# Patient Record
Sex: Male | Born: 1960 | Race: White | Hispanic: No | Smoking: Never smoker
Health system: Southern US, Community
[De-identification: ages and names within clinical notes are randomized; demographics above are authoritative.]

## PROBLEM LIST (undated history)

## (undated) DIAGNOSIS — K7460 Unspecified cirrhosis of liver: Secondary | ICD-10-CM

## (undated) DIAGNOSIS — Z87442 Personal history of urinary calculi: Secondary | ICD-10-CM

## (undated) DIAGNOSIS — K760 Fatty (change of) liver, not elsewhere classified: Secondary | ICD-10-CM

## (undated) DIAGNOSIS — Z860100 Personal history of colon polyps, unspecified: Secondary | ICD-10-CM

## (undated) DIAGNOSIS — I10 Essential (primary) hypertension: Secondary | ICD-10-CM

## (undated) DIAGNOSIS — E119 Type 2 diabetes mellitus without complications: Secondary | ICD-10-CM

## (undated) DIAGNOSIS — Z8719 Personal history of other diseases of the digestive system: Secondary | ICD-10-CM

## (undated) DIAGNOSIS — E6609 Other obesity due to excess calories: Secondary | ICD-10-CM

## (undated) HISTORY — DX: Personal history of other diseases of the digestive system: Z87.19

## (undated) HISTORY — DX: Unspecified cirrhosis of liver: K74.60

## (undated) HISTORY — PX: TONSILLECTOMY: SUR1361

## (undated) HISTORY — DX: Type 2 diabetes mellitus without complications: E11.9

## (undated) HISTORY — DX: Personal history of urinary calculi: Z87.442

## (undated) HISTORY — DX: Other obesity due to excess calories: E66.09

## (undated) HISTORY — DX: Essential (primary) hypertension: I10

## (undated) HISTORY — DX: Fatty (change of) liver, not elsewhere classified: K76.0

## (undated) HISTORY — PX: KNEE SURGERY: SHX244

## (undated) HISTORY — PX: SHOULDER OPEN ROTATOR CUFF REPAIR: SHX2407

## (undated) HISTORY — DX: Personal history of colonic polyps: Z86.010

## (undated) HISTORY — DX: Personal history of colon polyps, unspecified: Z86.0100

---

## 2000-07-14 ENCOUNTER — Encounter: Payer: Self-pay | Admitting: Specialist

## 2000-07-14 ENCOUNTER — Inpatient Hospital Stay (HOSPITAL_COMMUNITY): Admission: RE | Admit: 2000-07-14 | Discharge: 2000-07-15 | Payer: Self-pay | Admitting: Specialist

## 2001-10-15 ENCOUNTER — Observation Stay (HOSPITAL_COMMUNITY): Admission: RE | Admit: 2001-10-15 | Discharge: 2001-10-16 | Payer: Self-pay | Admitting: Specialist

## 2001-10-15 ENCOUNTER — Encounter: Payer: Self-pay | Admitting: Specialist

## 2006-10-28 ENCOUNTER — Encounter: Admission: RE | Admit: 2006-10-28 | Discharge: 2006-10-28 | Payer: Self-pay | Admitting: *Deleted

## 2007-01-04 HISTORY — PX: US ECHOCARDIOGRAPHY: HXRAD669

## 2007-07-23 IMAGING — US US ABDOMEN COMPLETE
1 series · 14 of 25 positions shown · non-contrast
Comparison: None.

CLINICAL DATA: Elevated liver enzymes.
ABDOMEN ULTRASOUND:
TECHNIQUE: Complete abdominal ultrasound examination was performed including evaluation of the liver, gallbladder, bile ducts, pancreas, kidneys, spleen, IVC, and abdominal aorta.

[Series 1: us abdomen complete · 0.37mm/px · 14 of 73 slices shown]
[im 1/73]
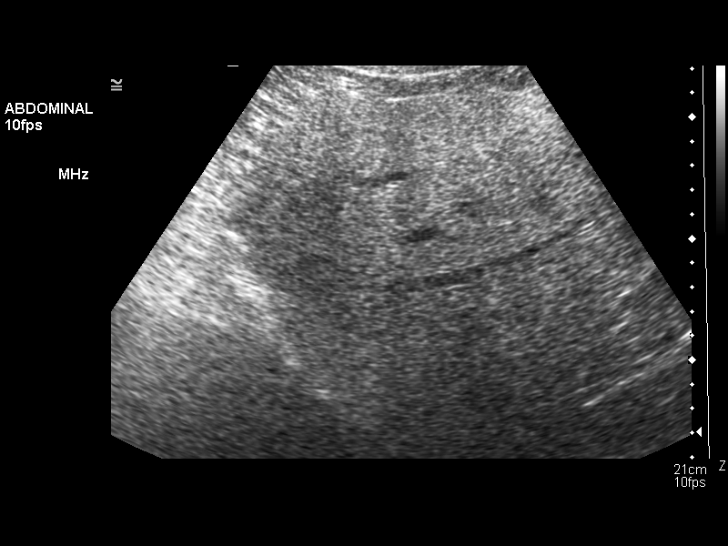
[im 7/73]
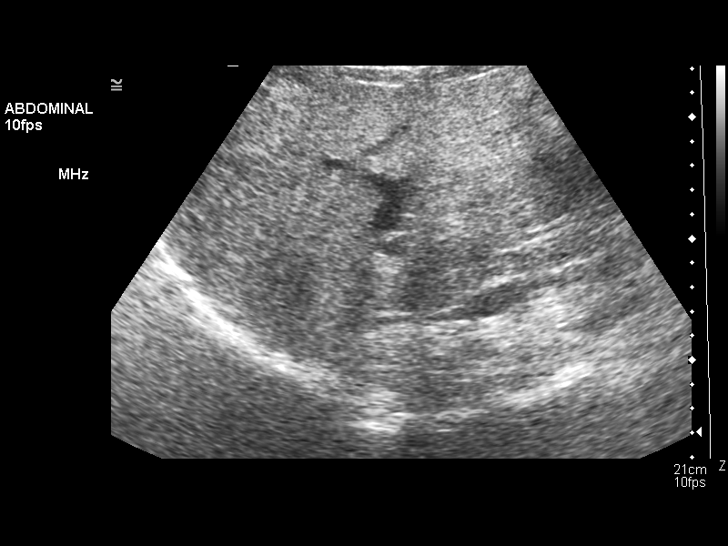
[im 13/73]
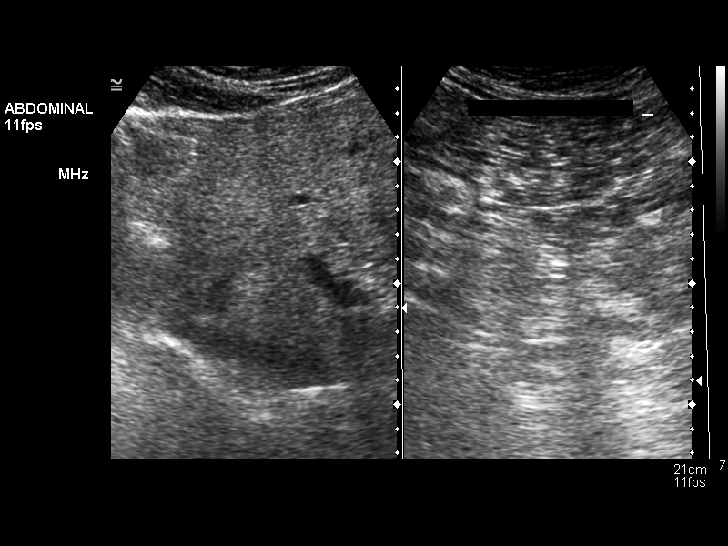
[im 19/73]
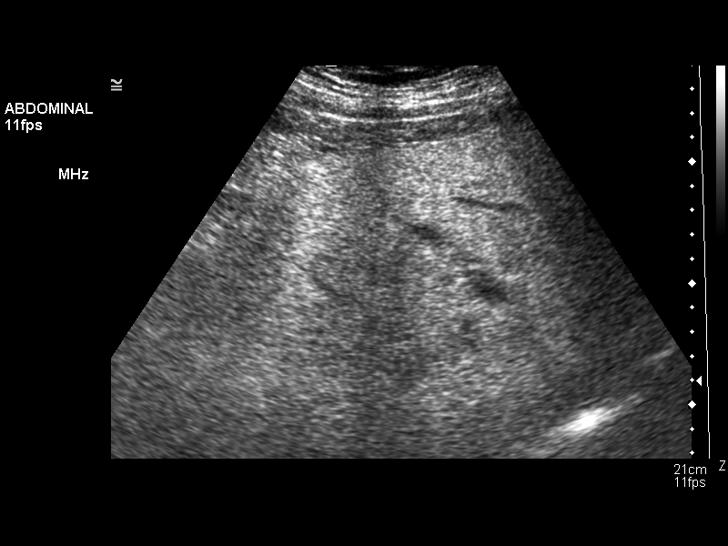
[im 25/73]
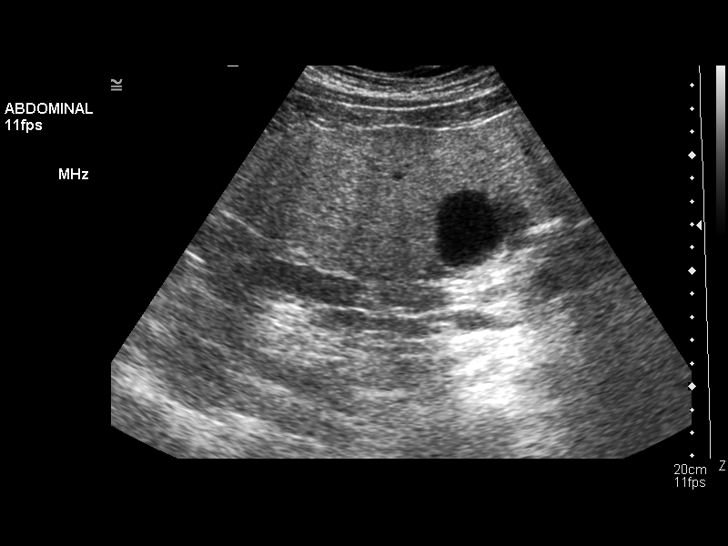
[im 28/73]
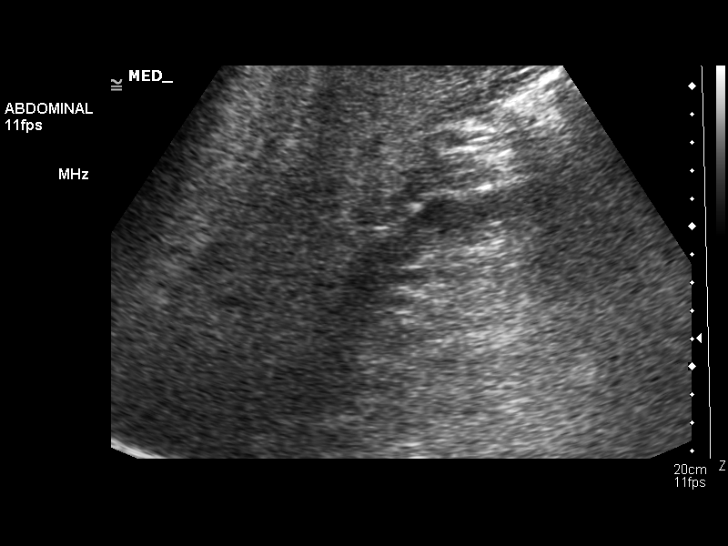
[im 34/73]
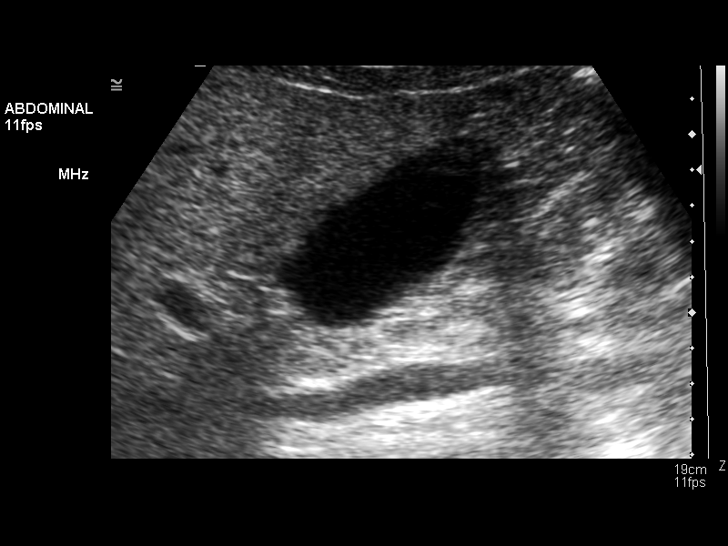
[im 40/73]
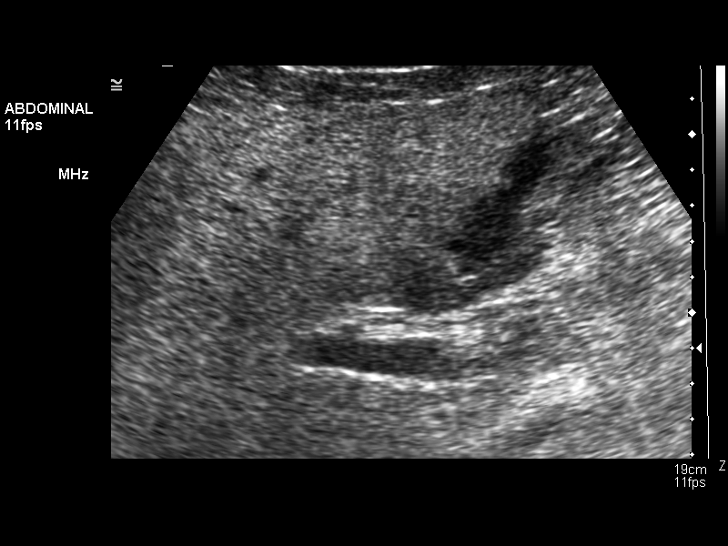
[im 46/73]
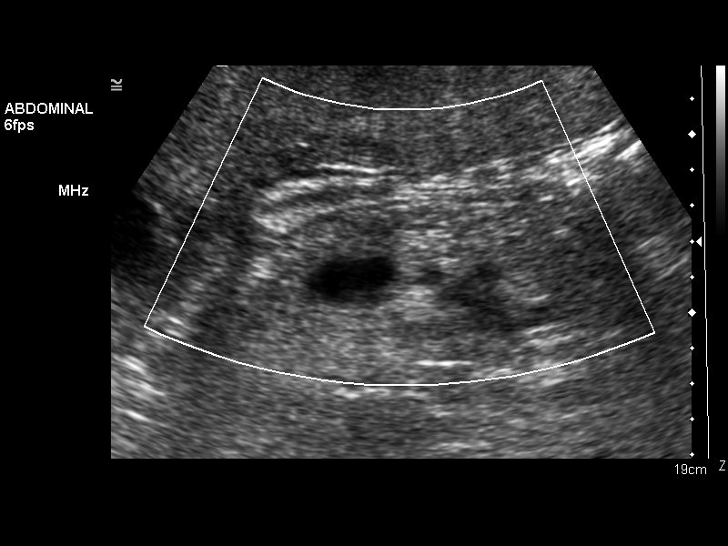
[im 49/73]
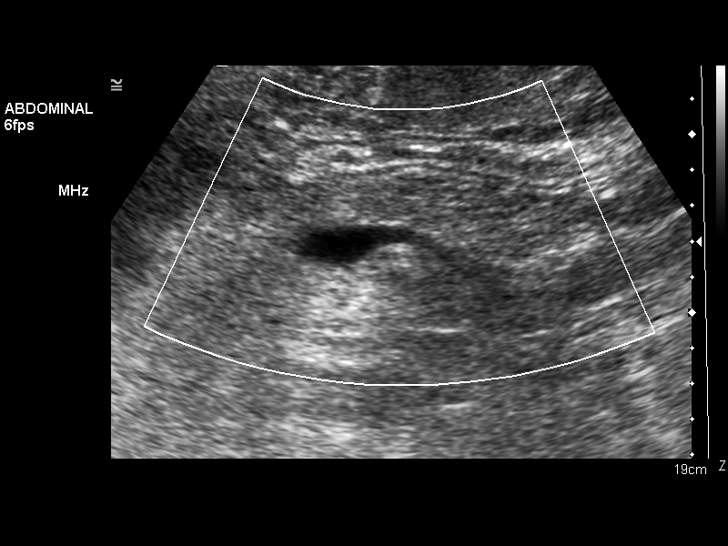
[im 55/73]
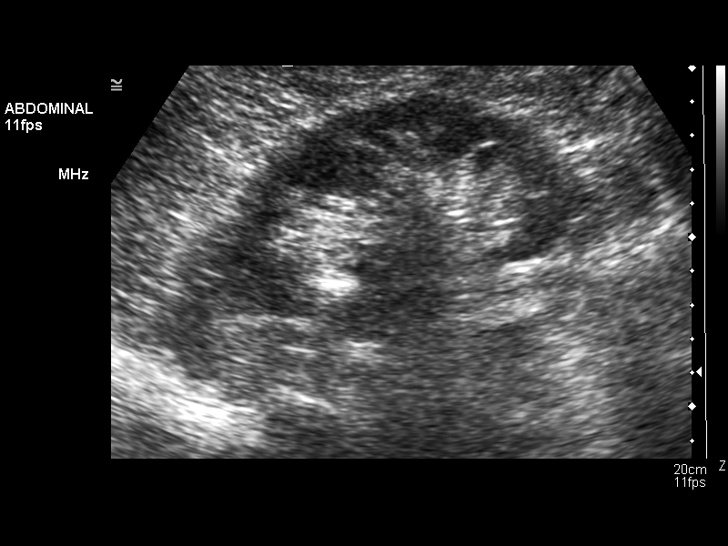
[im 61/73]
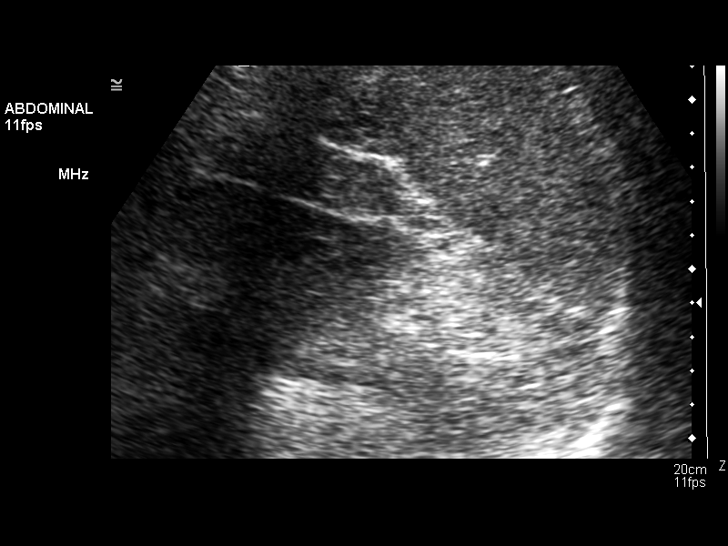
[im 67/73]
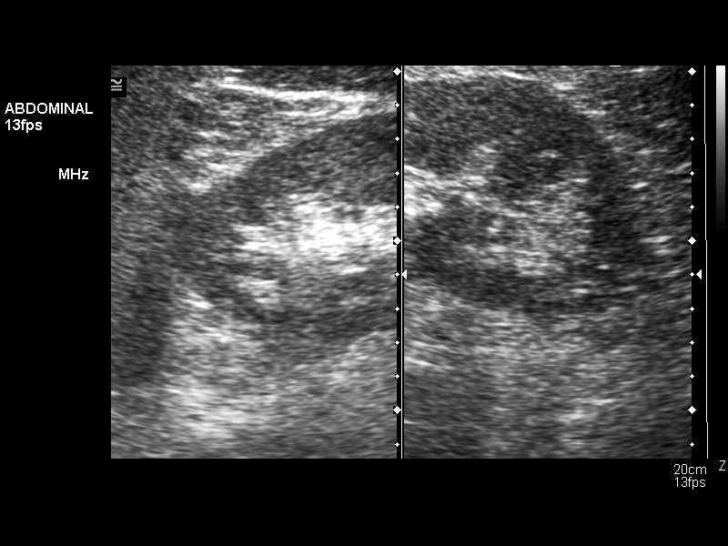
[im 73/73]
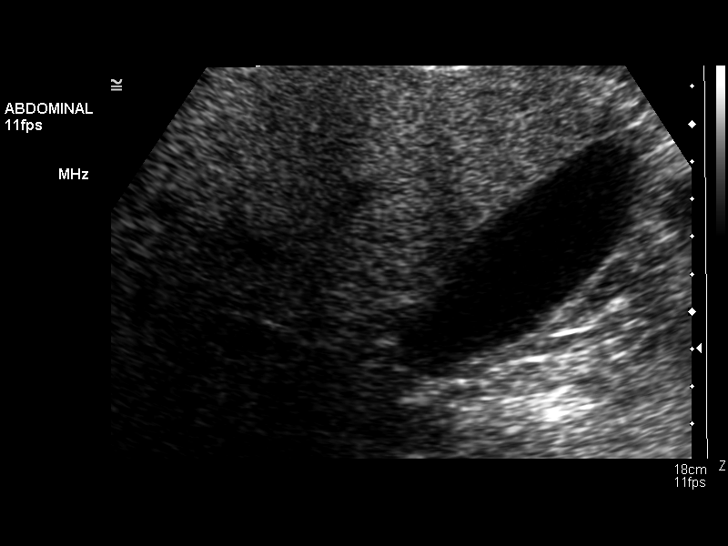

[14 of 25 positions shown; findings below may reference images not displayed]

FINDINGS: The gallbladder is normal without wall thickening, cholelithiasis, or pericholecystic fluid.  Gallbladder wall thickness is 2 mm.  Common bile duct diameter is 2.6 mm.  No dilatation or obstruction.  Liver demonstrates a heterogeneous increased echotexture suspicious for fatty infiltration.  Imaging of the IVC and pancreas is limited because of bowel gas.   The spleen has a length of 8.4 cm.  Right kidney measures 12.4 cm.  Left kidney measures 12.6 cm. No renal obstruction or hydronephrosis.  Imaging of the aorta is also limited due to bowel gas.  Maximal aortic diameter is 2.3 cm.  No ascites.
IMPRESSION: 1.  Heterogenous echogenic liver suggests fatty infiltration. 
2.  No biliary dilatation or acute finding.
3.  Limited imaging of the IVC, pancreas, and aorta.

## 2009-01-02 ENCOUNTER — Ambulatory Visit: Payer: Self-pay | Admitting: Internal Medicine

## 2009-01-02 DIAGNOSIS — K5730 Diverticulosis of large intestine without perforation or abscess without bleeding: Secondary | ICD-10-CM | POA: Insufficient documentation

## 2009-01-02 DIAGNOSIS — R933 Abnormal findings on diagnostic imaging of other parts of digestive tract: Secondary | ICD-10-CM

## 2009-01-04 ENCOUNTER — Encounter: Payer: Self-pay | Admitting: Internal Medicine

## 2009-01-04 ENCOUNTER — Ambulatory Visit: Payer: Self-pay | Admitting: Internal Medicine

## 2009-01-08 ENCOUNTER — Encounter: Payer: Self-pay | Admitting: Internal Medicine

## 2009-02-12 ENCOUNTER — Telehealth: Payer: Self-pay | Admitting: Internal Medicine

## 2009-02-13 ENCOUNTER — Encounter: Payer: Self-pay | Admitting: Internal Medicine

## 2010-08-20 ENCOUNTER — Ambulatory Visit: Payer: Self-pay | Admitting: Cardiology

## 2011-01-08 ENCOUNTER — Ambulatory Visit: Payer: Self-pay | Admitting: Cardiology

## 2011-01-14 NOTE — Procedures (Signed)
Summary: Colonoscopy   Colonoscopy  Procedure date:  01/04/2009  Findings:      Location:  [facility].    Procedures Next Due Date:    Colonoscopy: 01/2012  COLONOSCOPY PROCEDURE REPORT  PATIENT:  Cory Colon, Cory Colon  MR#:  [id number] BIRTHDATE:   11-07-1961   GENDER:   male  ENDOSCOPIST:   Wilhemina Bonito. Eda Keys, MD Referred by: SELF  PROCEDURE DATE:  01/04/2009 PROCEDURE:  Colonoscopy with snare polypectomy ASA CLASS:   Class II INDICATIONS: family Hx of polyps,  questionable abnormal CT of abdomen   MEDICATIONS:    Versed 10 mg IV, Fentanyl 100 mcg IV  DESCRIPTION OF PROCEDURE:   After the risks benefits and alternatives of the procedure were thoroughly explained, informed consent was obtained.  Digital rectal exam was performed and revealed no abnormalities.   The LB CF-[id number] endoscope was introduced through the anus and advanced to the cecum, which was identified by both the appendix and ileocecal valve, without limitations. TIME TO CECUM = 1:46 MIN The quality of the prep was excellent, using MoviPrep.  The instrument was then  withdrawn (TIME = 15:49 MIN) as the colon was fully examined. <<PROCEDUREIMAGES>>            <<OLD IMAGES>>  FINDINGS:  Three polyps were found (cecal 5mm; descending 23mm,4mm). Polyps were snared without cautery. Retrieval was successful.  Mild diverticulosis was found in the sigmoid colon.   Retroflexed views in the rectum revealed no abnormalities.    The scope was then withdrawn from the patient and the procedure completed.  COMPLICATIONS:   None  ENDOSCOPIC IMPRESSION:  1) Three polyps (benign appearring) - removed  2) Mild diverticulosis in the sigmoid colon  3) Normal colonoscopy otherwise  RECOMMENDATIONS:  1) If all of the polyps removed today are proven to be adenomatous (pre-cancerous) polyps, you will need a colonoscopy in 3 years. Otherwise 5 years.     _______________________________ Wilhemina Bonito. Eda Keys,  MD  CC: The Patient;  DR Desmond Dike      REPORT OF SURGICAL PATHOLOGY   Case #: [id number] Patient Name: Cory Colon, Cory Colon. Office Chart Number:  [id number]   MRN: [id number] Pathologist: Alden Server A. Delila Spence, MD DOB/Age  [birth year] (Age: 50)    Gender: M Date Taken:  01/04/2009 Date Received: 01/05/2009   FINAL DIAGNOSIS   ***MICROSCOPIC EXAMINATION AND DIAGNOSIS***   1.  COLON, CECUM POLYP:  TUBULAR ADENOMA.  NO HIGH GRADE DYSPLASIA OR MALIGNANCY IDENTIFIED.   2.  DESCENDING COLON, POLYP:  TUBULAR ADENOMAS.  NO HIGH GRADE DYSPLASIA OR MALIGNANCY IDENTIFIED. (TWO)   gdt Date Reported:  01/08/2009     Alden Server A. Delila Spence, MD  January 08, 2009 MRN: [id number]    Cory Colon [address] [place], [place]  [address]    Dear Cory Colon,  I am pleased to inform you that the colon polyp(s) removed during your recent colonoscopy was (were) found to be benign (no cancer detected) upon pathologic examination.  I recommend you have a repeat colonoscopy examination in 3 years to look for recurrent polyps, as having colon polyps increases your risk for having recurrent polyps or even colon cancer in the future.  Should you develop new or worsening symptoms of abdominal pain, bowel habit changes or bleeding from the rectum or bowels, please schedule an evaluation with either your primary care physician or with me.  Additional information/recommendations:  _X_ No further action with gastroenterology is needed at this time. Please  follow-up with your primary care physician for your other healthcare      needs.  Please call us if you are having persistent problems or have questions about your condition that have not been fully answered at this time.  Sincerely,  Hilarie Fredrickson MD   This report was created from the original endoscopy report, which was reviewed and signed by the above listed endoscopist.

## 2011-05-02 NOTE — Op Note (Signed)
[facility]  Patient:    Cory Colon                          MRN: [id number] Proc. Date: 07/14/00 Adm. 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  POSTOPERATIVE DIAGNOSIS:  The patient was found to have spondylosis changes involving the uncovertebral joints bilaterally at the C6-7 level with acute disk protrusion overlying the right uncovertebral joint causing right C7 nerve root compression.  PROCEDURE:  Anterior cervical diskectomy at the C6-7 level, anterior cervical fusion C6-7 with allograft freeze-dried bone graft and a 24 mm small stature plate and locking screws.  SURGEON:  Kerrin Champagne, M.D.  ASSISTANT:  Georges Lynch. Darrelyn Hillock, M.D.  ANESTHESIA:  GOT.  Dr. Gypsy Balsam.  ESTIMATED BLOOD LOSS:  75 cc.  DRAINS:  Jackson-Pratt drain x 1, left neck, sewn in place.  BRIEF CLINICAL HISTORY:  This patient is a 50 year old male, who has a 59-month history of increasing neck pain radiation to the left shoulder and left arm. He has undergone attempts at conservative management, including use of cervical traction, McKenzie exercise program.  He has undergone use of a Medrol dose pack, all without relief of pain.  He works as an Hotel manager and because of persistent numbness and pain, patient is brought to the operating room to undergo excision of HNP at the C6-7 level.  This clinical examination demonstrates that he has some weakness of right elbow extension, finger extension.  Slight right wrist flexion weakness as well. MRI study demonstrated a large broad-based disk protrusion at the C6-7 level. Patient is also found to have spondylosis changes at C5-6 and C6-7 with primarily disk material to the left side and spurs to the left side, most all of his pain is right-sided.  INTRAOPERATIVE  FINDINGS:  As discussed under postoperative diagnosis.  DESCRIPTION OF PROCEDURE:  After adequate general anesthesia, the patient in a beach chair position.  The head on the [place] horseshoe with the transverse roll of the upper shoulder.  Head in 5 pounds of cervical halter traction.  Standard prep of the anterior aspect of the neck and left neck using Duraprep solution, draped in the usual manner, iodine-impregnated Vi-drape.  Standard preoperative antibiotics.  Incision in line with the skin crease at the expected C6 level approximately 3.5 cm in length through skin and subcutaneous layers using a 10-blade scalpel after infiltration with Marcaine 0.5% with 1:200,000 epinephrine.  The incision carried to the level of the superficial platysma fascia.  This was then spread in the midline and interval developed between the trachea, esophagus and the carotid sheath using blunt dissection and finger dissection to the anterior aspect of the cervical spine and prevertebral fascia. Hand-held Clowards and [employer] then used for retraction and exposure here. The prevertebral fascia was then cauterized along the medial border of the left longus colli muscle and then teased across the midline using a Kitner dissectors.  Spinal needles were then placed at the C5-6 and C6-7 levels. This, with the sheath intact only allowing about a cm of the needle protrude. Needles were then inserted.  Intraoperative radiograph demonstrated the needle at the C5-6 level and the lower needle at C6-7 identifying level.  Under direct visualization, the upper needle was then removed and under direct visualization, the lower needle was then removed and a 15 blade scalpel then used to incise the anterior aspect of the cervical disk at the C6-7 level excising it using pituitary rongeurs and continuing the identification of this level for the remainder of the procedure. Medial border of longus colli muscle was then  developed bilaterally and freed up using first cautery to cauterize the small vessels medially and then Key elevator to elevate the longus colli muscle bilaterally exposing the anterior aspect of the cervical spine. McCullough retractors were then inserted with the foot of the McCullough retractor beneath the medial border of longus coli muscle and retraction obtained across the anterior aspect of the cervical spine providing excellent exposure.  Further incision was then made at the anterior annular material with a 15-blade scalpel, 3 mm Kerrisons then used to debride anterior osteophytes and the anterior-inferior lip of C6 and anterior-superior lip of C7 further exposing the disk space.  Pituitary rongeurs then used to excise disk material intervertebrally.  Small area over the anterior aspect of the vertebral body C6 and C7 were cauterized and then the distraction system inserted using 16 mm screw posts at both C6 and C7.  Excellent distraction was obtained. The endplates that were cartilaginous were then excised using microcurets both over the inferior aspect of C6, superior aspect of C7.  This was taken back to the posterior annular fibers.  This was completed, then the operating room microscope was draped, brought into the field sterilely.  Under the operating room microscope then high-speed bur was then used to carefully bur the endplates and to widen the intervertebral disk space further.  Curets were then used to carefully curet the posterior aspect of the annulus, as well as, the cartilaginous endplates superiorly, inferiorly within the disk space, exposing the posterior aspect of the disk space and the posterior longitudinal ligament.  One mm and 2 mm Kerrisons were then used to excise uncovertebral joint bilaterally.  On the right side, immediately disk material was noted to be posterior to the uncovertebral joint on the right side. Herniated disk material was excised using  further 2 mm Kerrison, as well as, titanium nerve hook.  This was excised out into the neural foramen on the right side until the C7 nerve root was felt to be completely free.  Osteophyte  over the posterior aspect of the intervertebral disk space at C6-7 was excised on the right side and hypertrophic changes of the uncovertebral joint were also excised using 1 and 2 mm Kerrisons.  Left uncovertebral joint was also further excised.  The midline posterior longitudinal ligament was kept in place as this did not appear to be causing any significant nerve root compression or cord compression.  When disk space had been debrided of intervertebral disk material, both neural foramen had been decompressed.  The depth of the intervertebral disk space was measured at 20 mm, the height measured 8 mm. The graft, 8 mm in height and measuring a depth of 17 mm was chosen.  The tricortical iliac crest bone graft, which was freeze-dried had been chosen earlier in the case and had been placed into normal saline solution for well over 30 minutes.  This graft was then cut to the dimensions of 8 mm height using dual blade oscillating saw to a depth of 17 mm.  Graft unfortunately was bevelled posteriorly and this required some shortening of the graft to about 16 mm.  The graft was then carefully tapered to the dimensions of the intervertebral disk space and keyed anteriorly to allow for countersink of the graft to provide better purchase here.  Bleeding was controlled within the intervertebral disk space using thrombin-soaked Gelfoam and releasing traction on the disk space.  When the bleeding was controlled, then the disk space was then redistracted.  Graft was then placed into the intervertebral disk space, releasing traction, the graft nicely fitted and was captured within the intervertebral disk space preserving the height of the intervertebral disk space.  Screw post were then removed above C6 and C7 and  bone wax applied to the bleeding screw post holes to obtain hemostasis.  Excess bone wax was removed.  A high-speed bur was then used to carefully bur the anterior spurs off of the anterior aspect of the disk space at C6-7 carefully smoothing the anterior aspect of this intervertebral disk space for preparation for cervical plate.  The length of the plate was chosen using bone wax applied to a cottonoid string and measured about 25 mm.  A 24 mm plate was chosen.  Plate was carefully bent using the bending arch to the normal lordotic curve of the cervical spine.  Plate was then placed across the disk space on the left side kneeling closely to the anterior aspect of the vertebral bodies of both C6 and C7, held in place in sagittal alignment.  Two fixation pins were placed on the left side at C7 and at C6 holding the plate in place in the sagittal orientation.  Then using the plate gripping drill guide, a 14 mm drill was used to drill a hole in the right side at C6 to a 14 mm depth, tapping was then performed and the 14 mm depth screw x 4.0 was then placed locking and fixing into the locking plate.  Similarly, the right C7 screw was placed. Difficulty was obtained countersinking this screw.  Fixation pins were then removed and then each of the screws placed at C7 on the left side and at C6 on the left side without difficulty drilling, appropriately tapping and placing appropriate 14 x 4.0 screws.  Each of these countersunk nicely with the exception of the right lower screw.  This screw was then removed and redrilling performed using a 14 mm drill bit and then placing a 4.35 bale out screw.  Fixation was obtained at this level on the right side at C7, countersinking the screw nicely.  The locking screws were then each placed at each level affixing each of the screws to the plate.  Irrigation was performed. Intraoperative radiograph obtained, which demonstrated the screws in good position and  alignment without evidence of entry into the spinal canal and with bone graft at the C6-7 level without evidence of retropulsion. Irrigation was performed.  Patient had general ooze from the area of the longus colli muscles, as well as, from the intervertebral disk area, that could not be stopped.  This was a very slow ooze, probably related to Vioxx usage and small areas of debrided bone.  With this then, decision was made to use a 7 mm Jackson-Pratt drain.  Stab incision was made over the anterior aspect of the neck and as expected, there was some bleeding from this site also requiring some electrocautery.  The drain was then inserted, placed along the left side of the neck deeply and sewn in place using a 4-0 nylon suture. Examination of the esophagus demonstrated no abnormalities.  No active  bleeders were felt to be present with examination by scope and direct observation.  Soft tissues were allowed to fall back into place.  The superficial fascia layer of the platysma was then reapproximated with interrupted 3-0 Vicryl sutures, deep subcutaneous layers approximated with interrupted 3-0 Vicryl sutures and the skin closed with interrupted 4-0 Vicryl sutures in an inverted fashion.  Tincture of benzoin and steri-strips applied. Four-by-four affixed to the skin with Hypafix tape.  The drain was connected to Jackson-Pratt bulb and charged.  Patient was then placed in a [place] collar, reactivated and extubated and returned to the recovery room in satisfactory condition. DD:  07/14/00 TD:  07/14/00 Job: [id number]

## 2011-05-02 NOTE — H&P (Signed)
[facility]  Patient:    Cory Colon, Cory Colon Visit Number: [id number] MRN: [id number]          Service Type: Attending:  Kerrin Champagne, M.D. Dictated by:   Dorie Rank, P.A. Adm. Date:  10/15/01                           History and Physical  DATE OF BIRTH:  [birth year]  CHIEF COMPLAINT:  Cervical spine pain with some dysphagia.  HISTORY OF PRESENT ILLNESS:  Cory Colon is a pleasant 50 year old male who in July of 2001 underwent a fusion at C6-7.  He has progressed relatively well; however, on occasion he has had difficulty swallowing and recurrent episodes of regurgitation and dysphagia.  It was felt that with his above stated symptoms, he would benefit from undergoing removal of this plate.  His fusion appeared to be well-healed.  On x-ray in July of 2002, it was also noted that he had a spur at C5-6 and the plate seemed somewhat prominent.  The risks and benefits of the procedure were discussed with the patient in detail and he agreed to proceed.  PAST MEDICAL HISTORY:  He has been in good health.  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  MEDICATIONS: 1. Vioxx 25 mg one p.o. q.d. p.r.n. 2. Allegra D p.r.n.  ALLERGIES:  No known drug allergies.  VICODIN causes nausea and vomiting.  PAST SURGICAL HISTORY:  Tonsillectomy as a child.  Fusion C6-7 vertebrae in July of 2001.  SOCIAL HISTORY:  He has one child.  He lives alone.  He denies alcohol or tobacco use.  He is an air traffic controller.  FAMILY HISTORY:  Mother living at age 53.  She is healthy.  His maternal grandparents had a history of colon cancer, heart disease, and diabetes mellitus.  Father living, age 87.  History of hypertension.  REVIEW OF SYSTEMS:  GENERAL:  No fevers, chills, night sweats or bleeding tendencies.  PULMONARY:  No shortness of breath, productive cough, or hemoptysis.  CARDIOVASCULAR:  No chest pain, angina, or  orthopnea. NEUROLOGIC:  No seizures, headaches, or paralysis.  GASTROINTESTINAL:  No nausea, vomiting, diarrhea, constipation, or melena.  GENITOURINARY:  No dysuria, hematuria, or discharge.  PHYSICAL EXAMINATION:  GENERAL:  Well-developed, well-nourished, pleasant 50 year old white male, alert and oriented x 3.  VITAL SIGNS:  Pulse 64, respirations 12, blood pressure 120/90.  HEENT:  Head is atraumatic, normocephalic.  Oropharynx is clear.  NECK:  Supple.  Negative for carotid bruits bilaterally.  Negative for cervical lymphadenopathy palpated.  CHEST:  Lungs clear to auscultation bilaterally.  No wheezes, rhonchi, or rales.  BREASTS:  Not pertinent to present illness.  HEART:  Distant S1, S2.  Heart is regular rate and rhythm, negative for murmur, rub, or gallop.  ABDOMEN:  Round positive bowel sounds, nontender.  No hepatosplenomegaly.  GENITOURINARY:  No pertinent to present illness.  EXTREMITIES:  Muscle strength to bilateral upper extremities is 5/5 and symmetrical.  Biceps reflexes 1+ and symmetric.  Triceps 1+ and symmetric. Brachial radialis 1+ and symmetric.  Range of motion of the neck shows extension to _________ 20%, lateral bending and rotation by about 15%. Forward bending about a finger breadth and a half from the sternum.  SKIN:  Acyanotic.  No rashes or lesions.  There is a well-healed scar to the anterior neck.  LABORATORY AND X-RAYS:  Preoperative labs and x-rays are pending.  IMPRESSION: 1. Painful hardware from previous fusion at the C6-C7. 2. Osteophyte at C5-6. 3. Seasonal allergies.  PLAN:  The patient is scheduled for excision of plate and anterior osteophyte at the C5-C6 level with Dr. Vira Browns. Dictated by:   Dorie Rank, P.A. Attending:  Kerrin Champagne, M.D. DD:  10/08/01 TD:  10/09/01 Job: [id number]

## 2011-05-02 NOTE — Op Note (Signed)
[facility]  Patient:    Cory Colon, Cory Colon Visit Number: [id number] MRN: [id number]          Service Type: SUR Location: [id number] Attending Physician:  Lubertha South Dictated by:   Kerrin Champagne, M.D. Proc. Date: 10/15/01 Admit Date:  10/15/2001 Discharge Date: 10/16/2001                             Operative Report  PREOPERATIVE DIAGNOSIS:  Retained Synthes plate and screws at the C6-7 level with impingement on the C5-6 anterior disk space and osteophyte formation at this segment, C5-6.  POSTOPERATIVE DIAGNOSIS:  Retained Synthes plate and screws at the C6-7 level with impingement on the C5-6 anterior disk space and osteophyte formation at this segment, C5-6.  PROCEDURE:  Removal of hardware, Synthes locking plate and screws, 28 mm in length plate from the Y7-8 level, and excision of osteophytes over the anterior aspect of the C5-6 level.  SURGEON:  Kerrin Champagne, M.D.  ASSISTANT:  Dorie Rank, P.A.-C.  ANESTHESIA:  GOT - Ninfa Meeker, M.D.  ESTIMATED BLOOD LOSS:  50 cc.  DRAINS:  TLS x 1.  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  Underwent anterior diskectomy and fusion in July of 2001.  Postoperatively, the patient has persisted with complaints of dysphagia and even complaints of occasional regurgitation.  He underwent swallow studies that were unremarkable.  The plate appears to show some impingement on the upper disk space at the C5-6 level, and there does appear to be some anterior osteophyte formation at C5-6 that is quite abundant, probably related to irritation from the adjacent segment.  He is brought to the operating room to undergo excision of the plate and screws and excision of the anterior osteophytes at the C5-6 level.  DESCRIPTION OF PROCEDURE:  After adequate general anesthesia and the patient in the beach chair position, a five pounds of cervical  halter traction with the neck in slight extension, the Mayfield horse shoe used, and well-padded. Arms at the side and well-padded and lower extremities as well.  Standard prep with DuraPrep solution over the anterior aspect of the left side of the cervical spine, draped in the usual manner with an iodine impregnated Vidrape. The incision in line with the old previous incision scar at the expected C5-6 level, approximately 4-5 cm in length through the skin and subcutaneous layers using a 10 blade scalpel after infiltration with Marcaine 0.5% with 1:200,000 epinephrine, 4 cc.  The incision was carried down to the superficial platysmal fascial layer.  This was then incised in line with the skin incision, and through the deeper fascial layer as well using electrocautery. Metzenbaum scissors were then used to spread the deeper fascial planes, exposing the lateral aspect of the trachea and esophagus.  Blunt dissection was then used to carefully probe and develop the interval between the trachea and esophagus to the anterior aspect of the cervical spine where there was abundant scar tissue present over the longus colli muscle and over the anterior aspect of the cervical spine.  Retracting the trachea and esophagus with hand-held Clowards and hand-held retractors, then an incision was made on to the medial border of the longus colli muscle on to the cervical plate using a 15 blade scalpel and then the interval developed between the plate and overlying scar tissue layers using a Barista  as well as Optometrist.  This exposed the plate anteriorly. A McCullough retractor was inserted and careful inspection demonstrated that it was not impinging upon the esophagus significantly.  This was used to help with further exposure here.  The Physicist, medical were then used to further free up the scar tissue over the anterior aspect of the plate, and using sharp dissection with a 15 blade  scalpel, an incision made exposing the inferior aspect of the plate and the two lower screws.  The upper screws were partly covered with osteophyte from the C5-6 level.  The one locking screw was removed on the left side and the locking screw on the right side came out with the screw proper when removed on the right side at the C7 level.  On the left level after removal of the locking screw, the remaining 14 mm screw was then removed.  The superior screw holes were then exposed after carefully performing subperiosteal dissection using Cobb elevator over the superior aspect of the osteophyte on to the anterior body of C5 superiorly.  This was carried both to the right and left.  The high speed bur was then used to remove the osteophyte and expose the two holes present.  End cutting osteophyte resector was used to debride tissue over the anterior aspect of the these holes.  Care was taken to assure no other soft tissue was impinged upon with this device.  After exposure of these areas, then the locking screws were each individually removed, and then the 14 mm screws removed using the larger screwdriver.  The plate was then freed off from the anterior aspect of the cervical spine using a Cobb elevator removed from the ________.  Irrigation was performed. Careful inspection of the esophagus demonstrated no abnormality.  The anterior osteophyte at the C5-6 level was then carefully debrided by first exposing the superior right and left side to the osteophyte, and then retracting the soft tissue carefully, and using the high speed bur to remove this down to the level of the superior aspect of C5 vertebral body.  Following this, irrigation was performed.  There was no significant bleeding from the bone structures over the anterior aspect of C5 present, even with the resection of the osteophyte.  There was however some oozing from the soft tissues and scar tissue over the medial border of the  longus colli muscle in particular, but no bleeder in terms of veins or arteries here.  The esophagus had normal appearance.   Irrigation was performed and a 10 mm TLS drain was placed in the depth of the incision, exiting out inferior to the incision line anteriorly.  The platysmal layer was then approximated with interrupted 3-0 Vicryl sutures, deep subcutaneous layers with interrupted 2-0 Vicryl sutures.  The skin closed with a running subcuticular stitch of 4-0 Vicryl.  A tincture of Benzoin and Steri-Strips applied.  A 4 x 4 affixed to the skin with Hypafix tape.  The patient was then reactivated and returned to the recovery room in satisfactory condition.  All instrument and sponge counts were correct.  The patients plates and screws will be returned to him. Dictated by:   Kerrin Champagne, M.D. Attending Physician:  Lubertha South DD:  10/15/01 TD:  10/18/01 Job: [id number]

## 2011-08-07 ENCOUNTER — Encounter: Payer: Self-pay | Admitting: Nurse Practitioner

## 2011-08-19 ENCOUNTER — Telehealth: Payer: Self-pay | Admitting: Cardiology

## 2011-08-19 ENCOUNTER — Other Ambulatory Visit: Payer: Self-pay | Admitting: *Deleted

## 2011-08-19 DIAGNOSIS — I10 Essential (primary) hypertension: Secondary | ICD-10-CM

## 2011-08-19 NOTE — Telephone Encounter (Signed)
No note required for this note.

## 2011-08-20 ENCOUNTER — Ambulatory Visit: Payer: Self-pay | Admitting: Nurse Practitioner

## 2011-08-27 ENCOUNTER — Other Ambulatory Visit (INDEPENDENT_AMBULATORY_CARE_PROVIDER_SITE_OTHER): Payer: PRIVATE HEALTH INSURANCE | Admitting: *Deleted

## 2011-08-27 ENCOUNTER — Encounter: Payer: Self-pay | Admitting: Nurse Practitioner

## 2011-08-27 ENCOUNTER — Ambulatory Visit (INDEPENDENT_AMBULATORY_CARE_PROVIDER_SITE_OTHER): Payer: PRIVATE HEALTH INSURANCE | Admitting: Nurse Practitioner

## 2011-08-27 VITALS — BP 122/84 | HR 78 | Ht 69.0 in | Wt 275.2 lb

## 2011-08-27 DIAGNOSIS — I10 Essential (primary) hypertension: Secondary | ICD-10-CM

## 2011-08-27 DIAGNOSIS — E669 Obesity, unspecified: Secondary | ICD-10-CM | POA: Insufficient documentation

## 2011-08-27 LAB — HEPATIC FUNCTION PANEL
ALT: 21 U/L (ref 0–53)
Bilirubin, Direct: 0.1 mg/dL (ref 0.0–0.3)
Total Bilirubin: 0.5 mg/dL (ref 0.3–1.2)

## 2011-08-27 LAB — BASIC METABOLIC PANEL
Chloride: 105 mEq/L (ref 96–112)
Creatinine, Ser: 1 mg/dL (ref 0.4–1.5)
GFR: 86.06 mL/min (ref >60.00)
Potassium: 5 mEq/L (ref 3.5–5.1)

## 2011-08-27 LAB — LIPID PANEL
LDL Cholesterol: 109 mg/dL — ABNORMAL HIGH (ref 0–99)
Total CHOL/HDL Ratio: 4
Triglycerides: 139 mg/dL (ref 0.0–149.0)
VLDL: 27.8 mg/dL (ref 0.0–40.0)

## 2011-08-27 NOTE — Patient Instructions (Signed)
Try to work on your diet and weight loss Exercise is encouraged We will see you back in 6 months. Keep a check on your blood pressure at home. Goal is to stay less 135/85 Call for any problems. We will let you know what your labs show in a couple of days.

## 2011-08-27 NOTE — Assessment & Plan Note (Signed)
Blood pressure is ok. No change in his medicines. He is advised to get back on track with diet and weight loss. He will continue to monitor at home. We will see him again in 6 months. Patient is agreeable to this plan and will call if any problems develop in the interim.

## 2011-08-27 NOTE — Progress Notes (Signed)
    Cory Colon Date of Birth: [birth year]   History of Present Illness: Cory Colon is seen today for an 8 month check. He is seen for Dr. Patty Sermons. He is doing well. Unfortunately, his weight has gone back up. Blood pressure has been ok at home. No chest pain or shortness of breath. He is tolerating his medicines. No complaints.   Current Outpatient Prescriptions on File Prior to Visit  Medication Sig Dispense Refill  . amLODipine-olmesartan (AZOR) 5-20 MG per tablet Take 1 tablet by mouth daily.        Marland Kitchen aspirin 81 MG tablet Take 81 mg by mouth daily.       . Nutritional Supplements (JUICE PLUS FIBRE PO) Take by mouth daily.        Marland Kitchen terbinafine (LAMISIL) 250 MG tablet Take 250 mg by mouth daily.          No Known Allergies  Past Medical History  Diagnosis Date  . Hypertension   . Exogenous obesity   . History of liver disease   . History of kidney stones     Past Surgical History  Procedure Date  . Tonsillectomy   . Knee surgery   . Shoulder open rotator cuff repair   . US echocardiography 01/04/2007    EF 55-60%    History  Smoking status  . Former Smoker  . Quit date: 08/07/1991  Smokeless tobacco  . Not on file    History  Alcohol Use No    Family History  Problem Relation Age of Onset  . Hypertension Father     Review of Systems: The review of systems is positive for weight gain.  All other systems were reviewed and are negative.  Physical Exam: BP 122/84  Pulse 78  Ht 5\' 9"  (1.753 m)  Wt 275 lb 3.2 oz (124.83 kg)  BMI 40.64 kg/m2 Patient is very pleasant and in no acute distress. He is obese. Skin is warm and dry. Color is normal.  HEENT is unremarkable. Normocephalic/atraumatic. PERRL. Sclera are nonicteric. Neck is supple. No masses. No JVD. Lungs are clear. Cardiac exam shows a regular rate and rhythm. Abdomen is obese but soft. Extremities are without edema. Gait and ROM are intact. No gross neurologic deficits noted.   LABORATORY DATA:  PENDING   Assessment / Plan:

## 2011-08-27 NOTE — Assessment & Plan Note (Signed)
BMI is 41. We discussed weight loss in depth today.

## 2011-08-29 ENCOUNTER — Telehealth: Payer: Self-pay | Admitting: *Deleted

## 2011-08-29 NOTE — Telephone Encounter (Signed)
Message copied by Eugenia Pancoast on Fri Aug 29, 2011 10:33 AM ------      Message from: Cassell Clement      Created: Thu Aug 28, 2011  8:01 PM       Pl report.  Lipids are Okayexcept  LDL too high.  Must lose weight, watch diet.      BS too high.      Kidney and liver okay. CSD.

## 2011-08-29 NOTE — Telephone Encounter (Signed)
Advised of labs 

## 2011-11-17 ENCOUNTER — Telehealth: Payer: Self-pay | Admitting: Cardiology

## 2011-11-17 NOTE — Telephone Encounter (Signed)
Advised patient

## 2011-11-17 NOTE — Telephone Encounter (Signed)
New Msg: Pt calling wanting to know if pt needs to stop taking azor considering pt BP this morning was 98/62. 98/64 (one week ago)  90/68 (2 wks ago) 100/70 (3 wks ago). Please return pt call to discuss further.

## 2011-11-17 NOTE — Telephone Encounter (Signed)
Left message

## 2011-11-17 NOTE — Telephone Encounter (Signed)
Fu Call: pt returning call to [facility]. Please call back.

## 2011-11-17 NOTE — Telephone Encounter (Signed)
Try leaving off the Azor and continue to monitor the blood pressure closely.  If he goes back up he may need a simpler blood pressure medicine which is less strong.

## 2011-11-17 NOTE — Telephone Encounter (Signed)
Has lost about 25 pounds.  These readings are done after taking medications.  Has been sleeping longer in during the night.  Feels ok otherwise.  Please advise

## 2011-12-29 ENCOUNTER — Encounter: Payer: Self-pay | Admitting: Internal Medicine

## 2012-01-30 ENCOUNTER — Telehealth: Payer: Self-pay | Admitting: Cardiology

## 2012-01-30 DIAGNOSIS — I119 Hypertensive heart disease without heart failure: Secondary | ICD-10-CM

## 2012-01-30 NOTE — Telephone Encounter (Signed)
New Msg: Pt needs refill azor tabs 520 mg. Please call RX in [facility]. Pt needs 90 day supply.    Please return pt call to inform that RX was called in.

## 2012-02-03 ENCOUNTER — Other Ambulatory Visit: Payer: Self-pay | Admitting: Cardiology

## 2012-02-03 MED ORDER — AMLODIPINE-OLMESARTAN 5-20 MG PO TABS: 1.0000 | ORAL_TABLET | Freq: Every day | ORAL | Status: DC

## 2012-02-03 NOTE — Telephone Encounter (Signed)
Okay to refill Azor.

## 2012-02-03 NOTE — Telephone Encounter (Signed)
Per patient he has gained some weight back and blood pressure started going up.  Did start back on Azor and blood pressure doing well.  Today his blood pressure was 120's/80's. Will forward to  Dr. Patty Sermons to make sure ok to fill

## 2012-02-03 NOTE — Telephone Encounter (Signed)
Rx sent to [facility]

## 2012-09-24 ENCOUNTER — Encounter: Payer: Self-pay | Admitting: Internal Medicine

## 2016-02-05 ENCOUNTER — Encounter: Payer: Self-pay | Admitting: Internal Medicine

## 2018-05-11 ENCOUNTER — Encounter: Payer: Self-pay | Admitting: Neurology

## 2018-05-11 ENCOUNTER — Ambulatory Visit (INDEPENDENT_AMBULATORY_CARE_PROVIDER_SITE_OTHER): Payer: POS | Admitting: Neurology

## 2018-05-11 VITALS — BP 116/74 | HR 76 | Ht 68.0 in | Wt 268.0 lb

## 2018-05-11 DIAGNOSIS — R0683 Snoring: Secondary | ICD-10-CM

## 2018-05-11 DIAGNOSIS — R351 Nocturia: Secondary | ICD-10-CM

## 2018-05-11 DIAGNOSIS — G4719 Other hypersomnia: Secondary | ICD-10-CM | POA: Diagnosis not present

## 2018-05-11 DIAGNOSIS — Z6841 Body Mass Index (BMI) 40.0 and over, adult: Secondary | ICD-10-CM

## 2018-05-11 NOTE — Patient Instructions (Signed)

## 2018-05-11 NOTE — Progress Notes (Signed)
Subjective:    Patient ID: Cory Colon is a 57 y.o. male.  HPI     Cory Foley, MD, PhD [facility] [address], [address] [address] [place], [place] [address]  Dear Dr. Buzzy Han,   I saw your patient, Cory Colon, upon your kind request in my neurologic clinic today for initial consultation of his sleep disorder, concern for underlying obstructive sleep apnea. The patient is unaccompanied today. As you know, Cory Colon is a 57 year old right-handed gentleman with an underlying medical history of liver disease, reflux disease, kidney stones, type 2 diabetes, hypertension, and morbid obesity with a BMI of over 40, who reports snoring and excessive daytime somnolence. He is being evaluated for bariatric surgery. I reviewed your office note from 03/15/2018 which you kindly included. He is being considered for laparoscopic sleeve gastrectomy.  His Epworth sleepiness score is 12 out of 24, fatigue score is 25 out of 63. He is married and lives with his wife, he has one son age 45 from his first marriage. He is a nonsmoker and does not utilize alcohol typically, has caffeine in limitation, not every day. 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 His bedtime is around 10:30, rise time around 5. When he was in air traffic control he had swing shift including first, second and third shift. He denies telltale symptoms of restless leg syndrome. He has nocturia about once per average night. Denies morning headaches. His snoring can be loud and disturbing to his wife. He does not have a family history of obstructive sleep apnea. They do have a TV in the bedroom but he rarely relies on it. They have a small dog in the household, sleeps at the foot end of the bed in his own dog bed.   His Past Medical History Is Significant  For: Past Medical History:  Diagnosis Date  . Cirrhosis (HCC)   . Diabetes mellitus without complication (HCC)   . Exogenous obesity   . Fatty liver   . History of colon polyps   . History of kidney stones   . History of liver disease   . Hypertension     His Past Surgical History Is Significant For: Past Surgical History:  Procedure Laterality Date  . KNEE SURGERY    . SHOULDER OPEN ROTATOR CUFF REPAIR    . TONSILLECTOMY    . US ECHOCARDIOGRAPHY  01/04/2007   EF 55-60%; Normal stress echo    His Family History Is Significant For: Family History  Problem Relation Age of Onset  . Hypertension Father     His Social History Is Significant For: Social History   Socioeconomic History  . Marital status: Divorced    Spouse name: Not on file  . Number of children: Not on file  . Years of education: Not on file  . Highest education level: Not on file  Occupational History  . Not on file  Social Needs  . Financial resource strain: Not on file  . Food insecurity:    Worry: Not on file    Inability: Not on file  . Transportation needs:    Medical: Not on file    Non-medical: Not on file  Tobacco Use  . Smoking status: Never Smoker  . Smokeless tobacco: Never Used  Substance and Sexual Activity  .  Alcohol use: No  . Drug use: No  . Sexual activity: Not on file  Lifestyle  . Physical activity:    Days per week: Not on file    Minutes per session: Not on file  . Stress: Not on file  Relationships  . Social connections:    Talks on phone: Not on file    Gets together: Not on file    Attends religious service: Not on file    Active member of club or organization: Not on file    Attends meetings of clubs or organizations: Not on file    Relationship status: Not on file  Other Topics Concern  . Not on file  Social History Narrative  . Not on file    His Allergies Are:  Allergies  Allergen Reactions  . Morphine Nausea And Vomiting  :   His Current  Medications Are:  Outpatient Encounter Medications as of 05/11/2018  Medication Sig  . amLODipine (NORVASC) 10 MG tablet Take 10 mg by mouth daily.  . chlorthalidone (HYGROTON) 25 MG tablet Take 25 mg by mouth daily.  . diflunisal (DOLOBID) 500 MG TABS tablet Take 500 mg by mouth.  . ferrous sulfate 325 (65 FE) MG tablet Take 325 mg by mouth daily with breakfast.  . metFORMIN (GLUCOPHAGE) 500 MG tablet Take 500 mg by mouth.  . Multiple Vitamin (MULTIVITAMIN) tablet Take 1 tablet by mouth daily.  . Nutritional Supplements (JUICE PLUS FIBRE PO) Take by mouth daily.    . pantoprazole (PROTONIX) 40 MG tablet Take 40 mg by mouth daily.  . Potassium 99 MG TABS Take by mouth.  . [DISCONTINUED] aspirin 81 MG tablet Take 81 mg by mouth daily.   . [DISCONTINUED] terbinafine (LAMISIL) 250 MG tablet Take 250 mg by mouth daily.     No facility-administered encounter medications on file as of 05/11/2018.   :  Review of Systems:  Out of a complete 14 point review of systems, all are reviewed and negative with the exception of these symptoms as listed below: Review of Systems  Neurological:       Pt presents today to discuss his sleep. Pt has never had a sleep study but does endorse snoring.  Epworth Sleepiness Scale 0= would never doze 1= slight chance of dozing 2= moderate chance of dozing 3= high chance of dozing  Sitting and reading: 1 Watching TV: 2 Sitting inactive in a public place (ex. Theater or meeting): 2 As a passenger in a car for an hour without a break: 3 Lying down to rest in the afternoon: 3 Sitting and talking to someone: 0 Sitting quietly after lunch (no alcohol): 1 In a car, while stopped in traffic: 0 Total: 12     Objective:  Neurological Exam  Physical Exam Physical Examination:   Vitals:   05/11/18 1303  BP: 116/74  Pulse: 76   General Examination: The patient is a very pleasant 57 y.o. male in no acute distress. He appears well-developed and well-nourished  and well groomed.   HEENT: Normocephalic, atraumatic, pupils are equal, round and reactive to light and accommodation. Extraocular tracking is good without limitation to gaze excursion or nystagmus noted. Normal smooth pursuit is noted. Hearing is grossly intact. Tympanic membranes are clear bilaterally. Face is symmetric with normal facial animation and normal facial sensation. Speech is clear with no dysarthria noted. There is no hypophonia. There is no lip, neck/head, jaw or voice tremor. Neck is supple with full range of passive and active  motion. There are no carotid bruits on auscultation. Oropharynx exam reveals: mild mouth dryness, good dental hygiene and mild airway crowding, due to redundant soft palate. Mallampati is class II. Tongue protrudes centrally and palate elevates symmetrically. Tonsils are absent. Neck size is 18 3/4 inches. He has a Mild overbite.   Chest: Clear to auscultation without wheezing, rhonchi or crackles noted.  Heart: S1+S2+0, regular and normal without murmurs, rubs or gallops noted.   Abdomen: Soft, non-tender and non-distended with normal bowel sounds appreciated on auscultation.  Extremities: There is 1+ pitting edema in the distal lower extremities bilaterally. Pedal pulses are intact.  Skin: Warm and dry without trophic changes noted.  Musculoskeletal: exam reveals no obvious joint deformities, tenderness or joint swelling or erythema.   Neurologically:  Mental status: The patient is awake, alert and oriented in all 4 spheres. His immediate and remote memory, attention, language skills and fund of knowledge are appropriate. There is no evidence of aphasia, agnosia, apraxia or anomia. Speech is clear with normal prosody and enunciation. Thought process is linear. Mood is normal and affect is normal.  Cranial nerves II - XII are as described above under HEENT exam. In addition: shoulder shrug is normal with equal shoulder height noted. Motor exam: Normal bulk,  strength and tone is noted. There is no drift, tremor or rebound. Romberg is negative. Reflexes are 1+ throughout. Fine motor skills and coordination: grossly intact.  Cerebellar testing: No dysmetria or intention tremor. There is no truncal or gait ataxia.  Sensory exam: intact to light touch in the upper and lower extremities.  Gait, station and balance: He stands easily. No veering to one side is noted. No leaning to one side is noted. Posture is age-appropriate and stance is narrow based. Gait shows normal stride length and normal pace. No problems turning are noted. Tandem walk is unremarkable. Intact toe and heel stance is noted.               Assessment and Plan:  In summary, CASHTYN POULIOT is a very pleasant 57 y.o.-year old male with an underlying medical history of liver disease, reflux disease, kidney stones, type 2 diabetes, hypertension, and morbid obesity with a BMI of over 40, whose history and physical exam are concerning for obstructive sleep apnea (OSA). I had a long chat with the patient about my findings and the diagnosis of OSA, its prognosis and treatment options. We talked about medical treatments, surgical interventions and non-pharmacological approaches. I explained in particular the risks and ramifications of untreated moderate to severe OSA, especially with respect to developing cardiovascular disease down the Road, including congestive heart failure, difficult to treat hypertension, cardiac arrhythmias, or stroke. Even type 2 diabetes has, in part, been linked to untreated OSA. Symptoms of untreated OSA include daytime sleepiness, memory problems, mood irritability and mood disorder such as depression and anxiety, lack of energy, as well as recurrent headaches, especially morning headaches. We talked about trying to maintain a healthy lifestyle in general, as well as the importance of weight control. I encouraged the patient to eat healthy, exercise daily and keep well hydrated, to  keep a scheduled bedtime and wake time routine, to not skip any meals and eat healthy snacks in between meals. I advised the patient not to drive when feeling sleepy. I recommended the following at this time: sleep study with potential positive airway pressure titration. (We will score hypopneas at 4%).   I explained the sleep test procedure to the patient and  also outlined possible surgical and non-surgical treatment options of OSA, including the use of a custom-made dental device (which would require a referral to a specialist dentist or oral surgeon), upper airway surgical options, such as pillar implants, radiofrequency surgery, tongue base surgery, and UPPP (which would involve a referral to an ENT surgeon). Rarely, jaw surgery such as mandibular advancement may be considered.  I also explained the CPAP treatment option to the patient, who indicated that he would be willing to try CPAP if the need arises. I explained the importance of being compliant with PAP treatment, not only for insurance purposes but primarily to improve His symptoms, and for the patient's long term health benefit, including to reduce His cardiovascular risks. I answered all his questions today and the patient was in agreement. I would like to see him back after the sleep study is completed and encouraged him to call with any interim questions, concerns, problems or updates.   Thank you very much for allowing me to participate in the care of this nice patient. If I can be of any further assistance to you please do not hesitate to call me at [phone].  Sincerely,   Cory Foley, MD, PhD

## 2018-05-19 ENCOUNTER — Institutional Professional Consult (permissible substitution): Payer: Self-pay | Admitting: Neurology

## 2018-06-01 ENCOUNTER — Ambulatory Visit (INDEPENDENT_AMBULATORY_CARE_PROVIDER_SITE_OTHER): Payer: POS | Admitting: Neurology

## 2018-06-01 DIAGNOSIS — G4720 Circadian rhythm sleep disorder, unspecified type: Secondary | ICD-10-CM

## 2018-06-01 DIAGNOSIS — R0683 Snoring: Secondary | ICD-10-CM

## 2018-06-01 DIAGNOSIS — G4733 Obstructive sleep apnea (adult) (pediatric): Secondary | ICD-10-CM | POA: Diagnosis not present

## 2018-06-01 DIAGNOSIS — G4719 Other hypersomnia: Secondary | ICD-10-CM

## 2018-06-01 DIAGNOSIS — Z6841 Body Mass Index (BMI) 40.0 and over, adult: Secondary | ICD-10-CM

## 2018-06-01 DIAGNOSIS — R351 Nocturia: Secondary | ICD-10-CM

## 2018-06-02 ENCOUNTER — Telehealth: Payer: Self-pay

## 2018-06-02 NOTE — Procedures (Signed)
PATIENT'S NAME:  Cory Colon, Verland L DOB:      [birth year]      MR#:    [id number]     DATE OF RECORDING: 06/01/2018 REFERRING M.D.:  Lanice SchwabNikhil Teppara, MD Study Performed:   Baseline Polysomnogram HISTORY: 57 year old man with a history of liver disease, reflux disease, kidney stones, type 2 diabetes, hypertension, and morbid obesity with a BMI of over 40, who reports snoring and excessive daytime somnolence. He is being evaluated for bariatric surgery. The patient endorsed the Epworth Sleepiness Scale at 12/24 points. The patient's weight 268 pounds with a height of 68 (inches), resulting in a BMI of 40.8 kg/m2.  The patient's neck circumference measured 19 inches.  CURRENT MEDICATIONS: Norvasc, Hygroton, Dolobid, Ferrous Sulfate, Glucophage, Multivitamin, Plus Fibre, Protonix, Potassuim 99, Aspirin 81, Lamisil   PROCEDURE:  This is a multichannel digital polysomnogram utilizing the Somnostar 11.2 system.  Electrodes and sensors were applied and monitored per AASM Specifications.   EEG, EOG, Chin and Limb EMG, were sampled at 200 Hz.  ECG, Snore and Nasal Pressure, Thermal Airflow, Respiratory Effort, CPAP Flow and Pressure, Oximetry was sampled at 50 Hz. Digital video and audio were recorded.      BASELINE STUDY  Lights Out was at 22:32 and Lights On at 05:07.  Total recording time (TRT) was 394.5 minutes, with a total sleep time (TST) of  326.5 minutes.   The patient's sleep latency was 13 minutes. REM latency was 135 minutes, which is mildly delayed. The sleep efficiency was 82.8%.     SLEEP ARCHITECTURE: WASO (Wake after sleep onset) was 60 minutes with mild sleep fragmentation noted. There were 13.5 minutes in Stage N1, 224 minutes Stage N2, 17.5 minutes Stage N3 and 71.5 minutes in Stage REM.  The percentage of Stage N1 was 4.1%, Stage N2 was 68.6%, which is increased, Stage N3 was 5.4% and Stage R (REM sleep) was 21.9%, which is normal. The arousals were noted as: 27 were spontaneous, 0 were  associated with PLMs, 59 were associated with respiratory events.   RESPIRATORY ANALYSIS:  There were a total of 52 respiratory events:  7 obstructive apneas, 0 central apneas and 1 mixed apneas with a total of 8 apneas and an apnea index (AI) of 1.5 /hour. There were 44 hypopneas with a hypopnea index of 8.1 /hour. The patient also had 8 respiratory event related arousals (RERAs).      The total APNEA/HYPOPNEA INDEX (AHI) was 9.6/hour and the total RESPIRATORY DISTURBANCE INDEX was 0. 11. /hour.  0 events occurred in REM sleep and 89 events in NREM. The REM AHI was 0. 0 /hour, versus a non-REM AHI of 12.2. The patient spent 83 minutes of total sleep time in the supine position and 244 minutes in non-supine.. The supine AHI was 36.2/hour versus a non-supine AHI of 0.4.  OXYGEN SATURATION & C02:  The Wake baseline 02 saturation was 94%, with the lowest being 84%. Time spent below 89% saturation equaled 13 minutes. PERIODIC LIMB MOVEMENTS: The patient had a total of 0 Periodic Limb Movements.  The Periodic Limb Movement (PLM) index was 0 and the PLM Arousal index was 0/hour.  Audio and video analysis did not show any abnormal or unusual movements, behaviors, phonations or vocalizations. He was noted to be restless and had mouth venting. The patient took 1 bathroom break. Intermittent mild to moderate snoring was noted. The EKG was in keeping with normal sinus rhythm (NSR).  Post-study, the patient indicated that sleep was worse than  usual.   IMPRESSION:  1. Obstructive Sleep Apnea (OSA) 2. Dysfunctions associated with sleep stages or arousals from sleep   RECOMMENDATIONS:  1. This study demonstrates overall mild obstructive sleep apnea, severe during supine sleep with a total AHI of 9.6/hour, and supine AHI of 36.2/hour and O2 nadir of 84%. The absence of supine REM sleep may underestimate his AHI and O2 nadir to some extent. Given the patient's medical history and sleep related complaints, as well  as planned bariatric surgery, treatment with positive airway pressure is recommended; this can be achieved in the form of autoPAP. Alternatively, a full-night CPAP titration study would allow optimization of therapy, if needed down the road. Other treatment options may include avoidance of supine sleep position along with weight loss, upper airway or jaw surgery in selected patients or the use of an oral appliance in certain patients. ENT evaluation and/or consultation with a maxillofacial surgeon or dentist may be feasible in some instances.    2. Please note that untreated obstructive sleep apnea may carry additional perioperative morbidity. Patients with significant obstructive sleep apnea should receive perioperative PAP therapy and the surgeons and particularly the anesthesiologist should be informed of the diagnosis and the severity of the sleep disordered breathing. 3. This study shows sleep fragmentation and abnormal sleep stage percentages; these are nonspecific findings and per se do not signify an intrinsic sleep disorder or a cause for the patient's sleep-related symptoms. Causes include (but are not limited to) the first night effect of the sleep study, circadian rhythm disturbances, medication effect or an underlying mood disorder or medical problem.  4. The patient should be cautioned not to drive, work at heights, or operate dangerous or heavy equipment when tired or sleepy. Review and reiteration of good sleep hygiene measures should be pursued with any patient. 5. The patient will be seen in follow-up by Dr. Frances Furbish at [facility] for discussion of the test results and further management strategies. The referring provider will be notified of the test results.  I certify that I have reviewed the entire raw data recording prior to the issuance of this report in accordance with the Standards of Accreditation of the American Academy of Sleep Medicine (AASM)   Huston Foley, MD, PhD Diplomat, American  Board of Neurology and Sleep Medicine (Neurology and Sleep Medicine)

## 2018-06-02 NOTE — Progress Notes (Signed)
Patient referred by Dr. Buzzy Haneppara (gen surgery/bariatric surgeon), seen by me on 05/11/18, diagnostic PSG on 06/01/18.    Please call and notify the patient that the recent sleep study did confirm the diagnosis of obstructive sleep apnea. OSA is overall mild, but gets into severe range in supine sleep. Given his med Hx and planning bariatric surgery, mild OSA is worth treating to see if he feels better after treatment. To that end I recommend treatment for this in the form of autoPAP, which means, that we don't have to bring him back for a second sleep study with CPAP, but will let him try an autoPAP machine at home, through a DME company (of his choice, or as per insurance requirement). The DME representative will educate him on how to use the machine, how to put the mask on, etc. I have placed an order in the chart. Please send referral, talk to patient, send report to referring MD. We will need a FU in sleep clinic for 10 weeks post-PAP set up, please arrange that with me or one of our NPs. Thanks,   Cory FoleySaima Melissa Pulido, MD, PhD [facility])

## 2018-06-02 NOTE — Telephone Encounter (Signed)
I called pt. I advised pt that Dr. Frances FurbishAthar reviewed their sleep study results and found that pt has mild osa but severe osa in supine sleep. Dr. Frances FurbishAthar recommends that pt start an auto pap at home. I reviewed PAP compliance expectations with the pt. Pt is agreeable to starting an auto-PAP. I advised pt that an order will be sent to a DME, [facility], and [facility] will call the pt within about one week after they file with the pt's insurance. [facility] will show the pt how to use the machine, fit for masks, and troubleshoot the auto-PAP if needed. A follow up appt was made for insurance purposes with Dr. Frances FurbishAthar on 08/25/18 at 9:30am.Pt verbalized understanding to arrive 15 minutes early and bring their auto-PAP. A letter with all of this information in it will be mailed to the pt as a reminder. I verified with the pt that the address we have on file is correct. Pt verbalized understanding of results. Pt had no questions at this time but was encouraged to call back if questions arise.

## 2018-06-02 NOTE — Addendum Note (Signed)
Addended by: Huston FoleyATHAR, Lylee Corrow on: 06/02/2018 08:43 AM   Modules accepted: Orders

## 2018-06-02 NOTE — Telephone Encounter (Signed)
-----   Message from Huston FoleySaima Athar, MD sent at 06/02/2018  8:43 AM EDT ----- Patient referred by Dr. Buzzy Haneppara (gen surgery/bariatric surgeon), seen by me on 05/11/18, diagnostic PSG on 06/01/18.    Please call and notify the patient that the recent sleep study did confirm the diagnosis of obstructive sleep apnea. OSA is overall mild, but gets into severe range in supine sleep. Given his med Hx and planning bariatric surgery, mild OSA is worth treating to see if he feels better after treatment. To that end I recommend treatment for this in the form of autoPAP, which means, that we don't have to bring him back for a second sleep study with CPAP, but will let him try an autoPAP machine at home, through a DME company (of his choice, or as per insurance requirement). The DME representative will educate him on how to use the machine, how to put the mask on, etc. I have placed an order in the chart. Please send referral, talk to patient, send report to referring MD. We will need a FU in sleep clinic for 10 weeks post-PAP set up, please arrange that with me or one of our NPs. Thanks,   Huston FoleySaima Athar, MD, PhD [facility])

## 2018-06-10 NOTE — Telephone Encounter (Signed)
Received this message from [employer]: "Ok so he was playing phone tag with our office but did talk to [place] before l actually called her. He understands his insurance. I don't think he understood how [facility] [employer] requires him to call his ins. First. Ladona Ridgelaylor explained it again to him and he said he understood."

## 2018-06-10 NOTE — Telephone Encounter (Signed)
I called pt, he has heard from [facility] today, and needs to be in touch with his insurance. Pt will call me back if he has any further questions or concerns.

## 2018-06-10 NOTE — Telephone Encounter (Signed)
Pt has called [facility] several times, no answer, he said he has left a message. Pt is wanting to get this asap prior to 7/4, he said his bariatric surgery has been pushed back to September now. Can you check on this? Please call to advise at [phone]

## 2018-06-10 NOTE — Telephone Encounter (Signed)
I have reached out to [employer] to find out what is going on. 

## 2018-06-16 ENCOUNTER — Telehealth: Payer: Self-pay | Admitting: Neurology

## 2018-06-16 NOTE — Telephone Encounter (Signed)
I called pt. I advised him that right now the beginning of August is full, but I will watch for cancellations and call him if we are able to work him in. Pt verbalized understanding.

## 2018-06-16 NOTE — Telephone Encounter (Signed)
Patient calling stating he needs a sooner appointment for initial CPAP. He is scheduled for 08-25-18 with Dr. Frances FurbishAthar but he will get his machine today and says he needs appointment in 30 days so he can have bariatric surgery. Please call and discuss.

## 2018-06-21 NOTE — Telephone Encounter (Signed)
I spoke with Dr. Frances FurbishAthar, she is agreeable to seeing pt on 07/21/18 at 8:00am. I called pt offered him this appt date and time, pt understands to check in at 7:30am and of new appt date and time.

## 2018-07-19 ENCOUNTER — Encounter: Payer: Self-pay | Admitting: Neurology

## 2018-07-21 ENCOUNTER — Ambulatory Visit (INDEPENDENT_AMBULATORY_CARE_PROVIDER_SITE_OTHER): Payer: POS | Admitting: Neurology

## 2018-07-21 ENCOUNTER — Encounter: Payer: Self-pay | Admitting: Neurology

## 2018-07-21 VITALS — BP 108/72 | HR 54 | Ht 68.0 in | Wt 255.0 lb

## 2018-07-21 DIAGNOSIS — Z9989 Dependence on other enabling machines and devices: Secondary | ICD-10-CM

## 2018-07-21 DIAGNOSIS — G4733 Obstructive sleep apnea (adult) (pediatric): Secondary | ICD-10-CM | POA: Diagnosis not present

## 2018-07-21 NOTE — Progress Notes (Signed)
Subjective:    Patient ID: Cory Colon is a 57 y.o. male.  HPI     Interim history:   Cory Colon is a 57 year old right-handed gentleman with an underlying medical history of liver disease, reflux disease, kidney stones, type 2 diabetes, hypertension, and morbid obesity with a BMI of over 45, who presents for follow-up consultation of his obstructive sleep apnea, after sleep study testing and starting AutoPap therapy. The patient is unaccompanied today. I first met him on 05/11/2018 at the request of his surgeon as he was being considered for bariatric surgery. He reported snoring and daytime somnolence, Epworth sleepiness score was 12 out of 24 at the time. He was advised to proceed with a sleep study. He had a baseline sleep study on 06/01/2018. I went over his test results with him in detail today. Sleep efficiency was 82.8%, sleep latency 13 minutes, REM latency was 135 minutes. He had an increased percentage of stage II sleep, and a normal percentage of REM sleep. AHI was 9.6 per hour in total, supine AHI was 36.2 per hour. Average oxygen saturation was 94% with a nadir of 84%. He had some sleep disruption, no significant PLMS. Given his sleep related complaints and upcoming bariatric surgery he was advised to proceed with AutoPap therapy at home.  Today, 07/21/2018: I reviewed his CPPA compliance data from 06/20/2018 through 07/19/2018 which is a total of 30 days, during which time he used his AutoPap every night with percent used days greater than 4 hours at 93%, indicating excellent compliance with an average usage of 5 hours and 26 minutes, residual AHI 3 per hour, 95th percentile pressure of 9.3 cm with range of 5 cm to 12 cm with EPR, leak acceptable with the 95th percentile at 12.6 L/m. He reports that AutoPap is going well. He has his bariatric surgery date for next week. He reports that overall his daytime energy and sleep quality are better but using AutoPap is a nuisance. Nevertheless,  he is motivated to continue with treatment and has been fully compliant with it. There have been rare days where he had pulled off the mask in the middle of the night and not realize it. He uses a nasal cradle interface.  The patient's allergies, current medications, family history, past medical history, past social history, past surgical history and problem list were reviewed and updated as appropriate.   Previously (copied from previous notes for reference):   05/11/2018: (He) reports snoring and excessive daytime somnolence. He is being evaluated for bariatric surgery. I reviewed your office note from 03/15/2018 which you kindly included. He is being considered for laparoscopic sleeve gastrectomy.  His Epworth sleepiness score is 12 out of 24, fatigue score is 25 out of 63. He is married and lives with his wife, he has one son age 72 from his first marriage. He is a nonsmoker and does not utilize alcohol typically, has caffeine in limitation, not every day. 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 His bedtime is around 10:30, rise time around 5. When he was in air traffic control he had swing shift including first, second and third shift. He denies telltale symptoms of restless leg syndrome. He has nocturia about once per average night. Denies morning headaches. His snoring can be loud and disturbing to his wife.  He does not have a family history of obstructive sleep apnea. They do have a TV in the bedroom but he rarely relies on it. They have a small dog in the household, sleeps at the foot end of the bed in his own dog bed.   His Past Medical History Is Significant For: Past Medical History:  Diagnosis Date  . Cirrhosis ([place])   . Diabetes mellitus without complication ([place])   . Exogenous obesity   . Fatty liver   . History of colon  polyps   . History of kidney stones   . History of liver disease   . Hypertension     His Past Surgical History Is Significant For: Past Surgical History:  Procedure Laterality Date  . KNEE SURGERY    . SHOULDER OPEN ROTATOR CUFF REPAIR    . TONSILLECTOMY    . US ECHOCARDIOGRAPHY  01/04/2007   EF 55-60%; Normal stress echo    His Family History Is Significant For: Family History  Problem Relation Age of Onset  . Hypertension Father     His Social History Is Significant For: Social History   Socioeconomic History  . Marital status: Divorced    Spouse name: Not on file  . Number of children: Not on file  . Years of education: Not on file  . Highest education level: Not on file  Occupational History  . Not on file  Social Needs  . Financial resource strain: Not on file  . Food insecurity:    Worry: Not on file    Inability: Not on file  . Transportation needs:    Medical: Not on file    Non-medical: Not on file  Tobacco Use  . Smoking status: Never Smoker  . Smokeless tobacco: Never Used  Substance and Sexual Activity  . Alcohol use: No  . Drug use: No  . Sexual activity: Not on file  Lifestyle  . Physical activity:    Days per week: Not on file    Minutes per session: Not on file  . Stress: Not on file  Relationships  . Social connections:    Talks on phone: Not on file    Gets together: Not on file    Attends religious service: Not on file    Active member of club or organization: Not on file    Attends meetings of clubs or organizations: Not on file    Relationship status: Not on file  Other Topics Concern  . Not on file  Social History Narrative  . Not on file    His Allergies Are:  Allergies  Allergen Reactions  . Morphine Nausea And Vomiting  :   His Current Medications Are:  Outpatient Encounter Medications as of 07/21/2018  Medication Sig  . amLODipine (NORVASC) 10 MG tablet Take 10 mg by mouth daily.  . chlorthalidone (HYGROTON) 25 MG  tablet Take 25 mg by mouth daily.  . diflunisal (DOLOBID) 500 MG TABS tablet Take 500 mg by mouth.  . ferrous sulfate 325 (65 FE) MG tablet Take 325 mg by mouth daily with breakfast.  . metFORMIN (GLUCOPHAGE) 500 MG tablet Take 500 mg by mouth.  . Multiple Vitamin (MULTIVITAMIN) tablet Take 1 tablet by mouth daily.  . Nutritional Supplements (JUICE PLUS FIBRE PO) Take by mouth daily.    . pantoprazole (PROTONIX) 40 MG tablet Take 40 mg by mouth daily.  . Potassium 99 MG TABS Take by mouth.   No facility-administered encounter medications on file as of  07/21/2018.   :  Review of Systems:  Out of a complete 14 point review of systems, all are reviewed and negative with the exception of these symptoms as listed below:  Review of Systems  Neurological:       Patient reports that he has been doing well. He is scheduled for his bariatric surgery next week.     Objective:  Neurological Exam  Physical Exam Physical Examination:   Vitals:   07/21/18 0748  BP: 108/72  Pulse: (!) 54   General Examination: The patient is a very pleasant 57 y.o. male in no acute distress. He appears well-developed and well-nourished and well groomed.   HEENT: Normocephalic, atraumatic, pupils are equal, round and reactive to light and accommodation. Extraocular tracking is good without limitation to gaze excursion or nystagmus noted. Normal smooth pursuit is noted. Hearing is grossly intact. Face is symmetric with normal facial animation and normal facial sensation. Speech is clear with no dysarthria noted. There is no hypophonia. There is no lip, neck/head, jaw or voice tremor. Neck shows FROM. Oropharynx exam reveals: no new findings.   Chest: Clear to auscultation without wheezing, rhonchi or crackles noted.  Heart: S1+S2+0, regular and normal without murmurs, rubs or gallops noted.   Abdomen: Soft, non-tender and non-distended with normal bowel sounds appreciated on auscultation.  Extremities: There  is trace to 1+ pitting edema in the distal lower extremities bilaterally.   Skin: Warm and dry without trophic changes noted.  Musculoskeletal: exam reveals no obvious joint deformities, tenderness or joint swelling or erythema.   Neurologically:  Mental status: The patient is awake, alert and oriented in all 4 spheres. His immediate and remote memory, attention, language skills and fund of knowledge are appropriate. There is no evidence of aphasia, agnosia, apraxia or anomia. Speech is clear with normal prosody and enunciation. Thought process is linear. Mood is normal and affect is normal.  Cranial nerves II - XII are as described above under HEENT exam. Motor exam: Normal bulk, strength and tone is noted. There is no tremor, fine motor skills and coordination: grossly intact.  Cerebellar testing: No dysmetria or intention tremor. There is no truncal or gait ataxia.  Sensory exam: intact to light touch in the upper and lower extremities.  Gait, station and balance: He stands easily. No veering to one side is noted. No leaning to one side is noted. Posture is age-appropriate and stance is narrow based. Gait shows normal stride length and normal pace. No problems turning are noted.             Assessment and Plan:  In summary, Cory Colon is a very pleasant 57 year old male with an underlying medical history of liver disease, reflux disease, kidney stones, type 2 diabetes, hypertension, and morbid obesity with a BMI of over 98, who presents for follow-up consultation of his obstructive sleep apnea after a baseline sleep study and starting AutoPap therapy at home. He has overall mild sleep apnea, much more pronounced during supine sleep. He is fully compliant with AutoPap and his apnea score is at goal, he is highly commended first treatment adherence. He has noticed some improvement in his sleep quality and daytime symptoms. He has his bariatric surgery early next week. He is advised to  continue to be compliant with treatment after his surgery and before and will likely be requested to bring his AutoPap machine for his hospital [facility]. I suggested a routine follow-up in 6 months, we will track his weight  loss with him and also his AutoPap compliance down the Road. In the future, once he has achieved a significant amount of weight loss we can certainly revisit the sleep apnea diagnosis and I think about doing a repeat diagnostic sleep study versus home sleep test at the time. For now, he is encouraged to continue the treatment at the current settings. I answered all his questions today and he was in agreement.  I spent 25 minutes in total face-to-face time with the patient, more than 50% of which was spent in counseling and coordination of care, reviewing test results, reviewing medication and discussing or reviewing the diagnosis of OSA, its prognosis and treatment options. Pertinent laboratory and imaging test results that were available during this visit with the patient were reviewed by me and considered in my medical decision making (see chart for details).

## 2018-07-21 NOTE — Patient Instructions (Addendum)
You have done a great job with your autoPAP!  Good luck with your upcoming surgery!  Please continue using your autoPAP regularly. While your insurance requires that you use PAP at least 4 hours each night on 70% of the nights, I recommend, that you not skip any nights and use it throughout the night if you can. Getting used to PAP and staying with the treatment long term does take time and patience and discipline. Untreated obstructive sleep apnea when it is moderate to severe can have an adverse impact on cardiovascular health and raise her risk for heart disease, arrhythmias, hypertension, congestive heart failure, stroke and diabetes. Untreated obstructive sleep apnea causes sleep disruption, nonrestorative sleep, and sleep deprivation. This can have an impact on your day to day functioning and cause daytime sleepiness and impairment of cognitive function, memory loss, mood disturbance, and problems focussing. Using PAP regularly can improve these symptoms.

## 2018-08-25 ENCOUNTER — Ambulatory Visit: Payer: Self-pay | Admitting: Neurology

## 2019-01-24 ENCOUNTER — Ambulatory Visit: Payer: POS | Admitting: Neurology

## 2022-04-09 ENCOUNTER — Encounter: Payer: Self-pay | Admitting: Urology

## 2022-04-09 ENCOUNTER — Ambulatory Visit (INDEPENDENT_AMBULATORY_CARE_PROVIDER_SITE_OTHER): Payer: No Typology Code available for payment source | Admitting: Urology

## 2022-04-09 VITALS — BP 106/72 | HR 74

## 2022-04-09 DIAGNOSIS — Z87442 Personal history of urinary calculi: Secondary | ICD-10-CM | POA: Diagnosis not present

## 2022-04-09 DIAGNOSIS — N529 Male erectile dysfunction, unspecified: Secondary | ICD-10-CM

## 2022-04-09 DIAGNOSIS — Z125 Encounter for screening for malignant neoplasm of prostate: Secondary | ICD-10-CM | POA: Diagnosis not present

## 2022-04-09 DIAGNOSIS — N200 Calculus of kidney: Secondary | ICD-10-CM

## 2022-04-09 LAB — URINALYSIS, ROUTINE W REFLEX MICROSCOPIC
Bilirubin, UA: NEGATIVE
Glucose, UA: NEGATIVE
Leukocytes,UA: NEGATIVE
Nitrite, UA: NEGATIVE
Protein,UA: NEGATIVE
RBC, UA: NEGATIVE
Specific Gravity, UA: 1.025 (ref 1.005–1.030)
Urobilinogen, Ur: 1 mg/dL (ref 0.2–1.0)
pH, UA: 6.5 (ref 5.0–7.5)

## 2022-04-09 MED ORDER — VARDENAFIL HCL 20 MG PO TABS: 20.0000 mg | ORAL_TABLET | Freq: Every day | ORAL | 11 refills | Status: DC | PRN

## 2022-04-09 NOTE — Progress Notes (Signed)
? ?Assessment: ?1. Organic impotence   ?2. Nephrolithiasis   ?3. Prostate cancer screening   ? ? ?Plan: ?Rx for Levitra 20 mg prn ?PSA today ?Continue stone prevention with dietary modifications ?Return to office in 1 year. ? ?Chief Complaint: ?Chief Complaint  ?Patient presents with  ? Nephrolithiasis  ? Erectile Dysfunction  ? ? ?HPI: ?Cory Colon is a 61 y.o. male who presents for continued evaluation of nephrolithiasis and erectile dysfunction.  He was previously followed in [facility] and was last seen in May 2021.   ?He returns today for follow-up.  He has not used the Trimix for at least the past year.  He has not used any other medical therapy for erectile dysfunction.  No recent stone symptoms.  No lower urinary tract symptoms.  No dysuria or gross hematuria. ? ?Urologic History: ?He was seen in 9/15 for followup of his nephrolithiasis. Serial imaging studies have shown a stable 3 mm right renal calculus. CT scan from 9/18 showed a 4 mm distal right ureteral calculus at the UVJ and a 73mm right lower pole renal calculus.He passed a stone in 9/18. No recent stone symptoms.  ?KUB from 10/19 did not show any renal or ureteral calculi. ? ?He has history of ED and had been using Levitra with good results. He was notified by his insurance company that Levitra is no longer covered. He has used Viagra with good results. Most recently, he has noted a decrease in the results with Viagra. He reports difficulty maintaining his erections. He was recently seen at a men's clinic and given a penile injection. He had a satisfactory response following the injection. He was also told that he has a low testosterone and testosterone replacement therapy was recommended. Testosterone 428 10/19 ?He was started on injection therapy with Trimix in October 2019. ?He has been using Trimix 10/30/1 0.1 mL with good results. No pain with erection. No penile curvature.  ? ?PSA from 12/20: 0.53 ?PSA from 12/21: 0.51 ? ?Portions of the  above documentation were copied from a prior visit for review purposes only. ? ?Allergies: ?Allergies  ?Allergen Reactions  ? Morphine Nausea And Vomiting  ? ? ?PMH: ?Past Medical History:  ?Diagnosis Date  ? Cirrhosis (HCC)   ? Diabetes mellitus without complication (HCC)   ? Exogenous obesity   ? Fatty liver   ? History of colon polyps   ? History of kidney stones   ? History of liver disease   ? Hypertension   ? ? ?PSH: ?Past Surgical History:  ?Procedure Laterality Date  ? KNEE SURGERY    ? SHOULDER OPEN ROTATOR CUFF REPAIR    ? TONSILLECTOMY    ? US ECHOCARDIOGRAPHY  01/04/2007  ? EF 55-60%; Normal stress echo  ? ? ?SH: ?Social History  ? ?Tobacco Use  ? Smoking status: Never  ? Smokeless tobacco: Never  ?Substance Use Topics  ? Alcohol use: No  ? Drug use: No  ? ? ?ROS: ?Constitutional:  Negative for fever, chills, weight loss ?CV: Negative for chest pain, previous MI, hypertension ?Respiratory:  Negative for shortness of breath, wheezing, sleep apnea, frequent cough ?GI:  Negative for nausea, vomiting, bloody stool, GERD ? ?PE: ?BP 106/72   Pulse 74  ?GENERAL APPEARANCE:  Well appearing, well developed, well nourished, NAD ?HEENT:  Atraumatic, normocephalic, oropharynx clear ?NECK:  Supple without lymphadenopathy or thyromegaly ?ABDOMEN:  Soft, non-tender, no masses ?EXTREMITIES:  Moves all extremities well, without clubbing, cyanosis, or edema ?NEUROLOGIC:  Alert  and oriented x 3, normal gait, CN II-XII grossly intact ?MENTAL STATUS:  appropriate ?BACK:  Non-tender to palpation, No CVAT ?SKIN:  Warm, dry, and intact ?GU: ?Prostate: 30 g, NT, no nodules ?Rectum: Normal tone,  no masses or tenderness ? ? ? ?Results: ?U/A dipstick negative ? ?

## 2022-04-10 ENCOUNTER — Encounter: Payer: Self-pay | Admitting: Urology

## 2022-04-10 LAB — PSA: Prostate Specific Ag, Serum: 0.5 ng/mL (ref 0.0–4.0)

## 2022-04-29 ENCOUNTER — Encounter: Payer: Self-pay | Admitting: Urology

## 2022-04-29 NOTE — Telephone Encounter (Signed)
Please see patient request.

## 2023-04-15 ENCOUNTER — Encounter: Payer: Self-pay | Admitting: Urology

## 2023-04-15 ENCOUNTER — Ambulatory Visit: Payer: PRIVATE HEALTH INSURANCE | Admitting: Urology

## 2023-04-15 ENCOUNTER — Other Ambulatory Visit: Payer: Self-pay | Admitting: Urology

## 2023-04-15 ENCOUNTER — Ambulatory Visit: Payer: No Typology Code available for payment source | Admitting: Urology

## 2023-04-15 VITALS — BP 102/64 | HR 59 | Ht 68.0 in | Wt 237.0 lb

## 2023-04-15 DIAGNOSIS — N529 Male erectile dysfunction, unspecified: Secondary | ICD-10-CM

## 2023-04-15 DIAGNOSIS — N200 Calculus of kidney: Secondary | ICD-10-CM

## 2023-04-15 DIAGNOSIS — Z125 Encounter for screening for malignant neoplasm of prostate: Secondary | ICD-10-CM

## 2023-04-15 LAB — URINALYSIS, ROUTINE W REFLEX MICROSCOPIC
Bilirubin, UA: NEGATIVE
Glucose, UA: NEGATIVE
Leukocytes,UA: NEGATIVE
Nitrite, UA: NEGATIVE
Protein,UA: NEGATIVE
RBC, UA: NEGATIVE
Specific Gravity, UA: 1.02 (ref 1.005–1.030)
Urobilinogen, Ur: 1 mg/dL (ref 0.2–1.0)
pH, UA: 7 (ref 5.0–7.5)

## 2023-04-15 MED ORDER — VARDENAFIL HCL 20 MG PO TABS
20.0000 mg | ORAL_TABLET | Freq: Every day | ORAL | 11 refills | Status: DC | PRN
Start: 2023-04-15 — End: 2023-04-15

## 2023-04-15 MED ORDER — VARDENAFIL HCL 20 MG PO TABS
20.0000 mg | ORAL_TABLET | Freq: Every day | ORAL | 11 refills | Status: DC | PRN
Start: 2023-04-15 — End: 2024-04-14

## 2023-04-15 NOTE — Progress Notes (Signed)
Assessment: 1. Organic impotence   2. Nephrolithiasis   3. Prostate cancer screening     Plan: Rx for Levitra 20 mg prn Continue stone prevention with dietary modifications Return to office in 1 year.  Chief Complaint: Chief Complaint  Patient presents with   Erectile Dysfunction    HPI: Cory Colon is a 62 y.o. male who presents for continued evaluation of nephrolithiasis and erectile dysfunction.  He was previously followed in [facility] and was last seen in May 2021.   At his visit in 4/23, he reported that he had not used the Trimix for at least the past year.  He had not used any other medical therapy for erectile dysfunction.  No recent stone symptoms.  No lower urinary tract symptoms.  No dysuria or gross hematuria.  He returns today for follow-up.  He has not tried the Levitra that was prescribed last year.  He has not used the Trimix since his last visit.  No new urinary symptoms.  No dysuria or gross hematuria. IPSS = 2 today.  PSA results: 4/23 0.5 9/23 0.4  Urologic History: He was seen in 9/15 for followup of his nephrolithiasis. Serial imaging studies have shown a stable 3 mm right renal calculus. CT scan from 9/18 showed a 4 mm distal right ureteral calculus at the UVJ and a 5mm right lower pole renal calculus.He passed a stone in 9/18. No recent stone symptoms.  KUB from 10/19 did not show any renal or ureteral calculi.  He has history of ED and had been using Levitra with good results. He was notified by his insurance company that Levitra is no longer covered. He has used Viagra with good results. Most recently, he has noted a decrease in the results with Viagra. He reports difficulty maintaining his erections. He was recently seen at a men's clinic and given a penile injection. He had a satisfactory response following the injection. He was also told that he has a low testosterone and testosterone replacement therapy was recommended. Testosterone 428 10/19 He  was started on injection therapy with Trimix in October 2019. He has been using Trimix 10/30/1 0.1 mL with good results. No pain with erection. No penile curvature.   PSA from 12/20: 0.53 PSA from 12/21: 0.51  Portions of the above documentation were copied from a prior visit for review purposes only.  Allergies: Allergies  Allergen Reactions   Morphine Nausea And Vomiting    PMH: Past Medical History:  Diagnosis Date   Cirrhosis (HCC)    Diabetes mellitus without complication (HCC)    Exogenous obesity    Fatty liver    History of colon polyps    History of kidney stones    History of liver disease    Hypertension     PSH: Past Surgical History:  Procedure Laterality Date   KNEE SURGERY     SHOULDER OPEN ROTATOR CUFF REPAIR     TONSILLECTOMY     US ECHOCARDIOGRAPHY  01/04/2007   EF 55-60%; Normal stress echo    SH: Social History   Tobacco Use   Smoking status: Never   Smokeless tobacco: Never  Substance Use Topics   Alcohol use: No   Drug use: No    ROS: Constitutional:  Negative for fever, chills, weight loss CV: Negative for chest pain, previous MI, hypertension Respiratory:  Negative for shortness of breath, wheezing, sleep apnea, frequent cough GI:  Negative for nausea, vomiting, bloody stool, GERD  PE: BP 102/64  Pulse (!) 59   Ht 5\' 8"  (1.727 m)   Wt 237 lb (107.5 kg)   BMI 36.04 kg/m  GENERAL APPEARANCE:  Well appearing, well developed, well nourished, NAD HEENT:  Atraumatic, normocephalic, oropharynx clear NECK:  Supple without lymphadenopathy or thyromegaly ABDOMEN:  Soft, non-tender, no masses EXTREMITIES:  Moves all extremities well, without clubbing, cyanosis, or edema NEUROLOGIC:  Alert and oriented x 3, normal gait, CN II-XII grossly intact MENTAL STATUS:  appropriate BACK:  Non-tender to palpation, No CVAT SKIN:  Warm, dry, and intact GU: Prostate: 40 g, NT, no nodules Rectum: Normal tone,  no masses or  tenderness    Results: U/A:  3+ ketones

## 2023-10-16 ENCOUNTER — Telehealth: Payer: Self-pay | Admitting: Urology

## 2023-10-16 NOTE — Telephone Encounter (Signed)
Currently taking a pill (Generic version of levitra) and would like to go back to the injection.

## 2024-04-14 ENCOUNTER — Encounter: Payer: Self-pay | Admitting: Urology

## 2024-04-14 ENCOUNTER — Ambulatory Visit: Payer: No Typology Code available for payment source | Admitting: Urology

## 2024-04-14 VITALS — BP 128/83 | HR 56 | Ht 68.0 in | Wt 253.0 lb

## 2024-04-14 DIAGNOSIS — Z87442 Personal history of urinary calculi: Secondary | ICD-10-CM | POA: Diagnosis not present

## 2024-04-14 DIAGNOSIS — N529 Male erectile dysfunction, unspecified: Secondary | ICD-10-CM | POA: Diagnosis not present

## 2024-04-14 DIAGNOSIS — N200 Calculus of kidney: Secondary | ICD-10-CM

## 2024-04-14 DIAGNOSIS — Z125 Encounter for screening for malignant neoplasm of prostate: Secondary | ICD-10-CM

## 2024-04-14 LAB — URINALYSIS, ROUTINE W REFLEX MICROSCOPIC
Bilirubin, UA: NEGATIVE
Glucose, UA: NEGATIVE
Ketones, UA: NEGATIVE
Leukocytes,UA: NEGATIVE
Nitrite, UA: NEGATIVE
Protein,UA: NEGATIVE
RBC, UA: NEGATIVE
Specific Gravity, UA: 1.02 (ref 1.005–1.030)
Urobilinogen, Ur: 0.2 mg/dL (ref 0.2–1.0)
pH, UA: 7 (ref 5.0–7.5)

## 2024-04-14 MED ORDER — SILDENAFIL CITRATE 100 MG PO TABS
100.0000 mg | ORAL_TABLET | Freq: Every day | ORAL | 11 refills | Status: AC | PRN
Start: 2024-04-14 — End: ?

## 2024-04-14 NOTE — Progress Notes (Signed)
 Assessment: 1. Organic impotence   2. Nephrolithiasis   3. Prostate cancer screening     Plan: PSA today Rx for sildenafil  100 mg prn sent to pharmacy Continue stone prevention with dietary modifications Return to office in 1 year.  Chief Complaint: Chief Complaint  Patient presents with   Erectile Dysfunction    HPI: Cory Colon is a 63 y.o. male who presents for continued evaluation of nephrolithiasis and erectile dysfunction.  He was previously followed in [facility] and was last seen in May 2021.   At his visit in 4/23, he reported that he had not used the Trimix for at least the past year.  He had not used any other medical therapy for erectile dysfunction.  No recent stone symptoms.  No lower urinary tract symptoms.  No dysuria or gross hematuria.  PSA results: 4/23 0.5 9/23 0.4  He returns today for annual follow-up.  No new urinary symptoms.  He reports occasional frequency.  No dysuria or gross hematuria. IPSS = 1/1. No recent kidney stone symptoms. He continues to have problems with erectile dysfunction.  He did not see any results with vardenafil .  He has used Trimix with good results but does not wish to pursue penile injection therapy on a regular basis.  He would like to resume sildenafil  100 mg which worked fairly well for him in the past.  Urologic History: He was seen in 9/15 for followup of his nephrolithiasis. Serial imaging studies have shown a stable 3 mm right renal calculus. CT scan from 9/18 showed a 4 mm distal right ureteral calculus at the UVJ and a 5mm right lower pole renal calculus.He passed a stone in 9/18. No recent stone symptoms.  KUB from 10/19 did not show any renal or ureteral calculi.  He has history of ED and had been using Levitra  with good results. He was notified by his insurance company that Levitra  is no longer covered. He has used Viagra  with good results. Most recently, he has noted a decrease in the results with Viagra . He  reports difficulty maintaining his erections. He was recently seen at a men's clinic and given a penile injection. He had a satisfactory response following the injection. He was also told that he has a low testosterone and testosterone replacement therapy was recommended. Testosterone 428 10/19 He was started on injection therapy with Trimix in October 2019. He has been using Trimix 10/30/1 0.1 mL with good results. No pain with erection. No penile curvature.   PSA from 12/20: 0.53 PSA from 12/21: 0.51  Portions of the above documentation were copied from a prior visit for review purposes only.  Allergies: Allergies  Allergen Reactions   Morphine Nausea And Vomiting    PMH: Past Medical History:  Diagnosis Date   Cirrhosis (HCC)    Diabetes mellitus without complication (HCC)    Exogenous obesity    Fatty liver    History of colon polyps    History of kidney stones    History of liver disease    Hypertension     PSH: Past Surgical History:  Procedure Laterality Date   KNEE SURGERY     SHOULDER OPEN ROTATOR CUFF REPAIR     TONSILLECTOMY     US  ECHOCARDIOGRAPHY  01/04/2007   EF 55-60%; Normal stress echo    SH: Social History   Tobacco Use   Smoking status: Never   Smokeless tobacco: Never  Substance Use Topics   Alcohol use: No  Drug use: No    ROS: Constitutional:  Negative for fever, chills, weight loss CV: Negative for chest pain, previous MI, hypertension Respiratory:  Negative for shortness of breath, wheezing, sleep apnea, frequent cough GI:  Negative for nausea, vomiting, bloody stool, GERD  PE: BP 128/83   Pulse (!) 56   Ht 5\' 8"  (1.727 m)   Wt 253 lb (114.8 kg)   BMI 38.47 kg/m  GENERAL APPEARANCE:  Well appearing, well developed, well nourished, NAD HEENT:  Atraumatic, normocephalic, oropharynx clear NECK:  Supple without lymphadenopathy or thyromegaly ABDOMEN:  Soft, non-tender, no masses EXTREMITIES:  Moves all extremities well, without  clubbing, cyanosis, or edema NEUROLOGIC:  Alert and oriented x 3, normal gait, CN II-XII grossly intact MENTAL STATUS:  appropriate BACK:  Non-tender to palpation, No CVAT SKIN:  Warm, dry, and intact GU: Prostate: 30 g, nontender, no nodules Rectum: Normal tone,  no masses or tenderness    Results: U/A: Negative

## 2024-04-15 ENCOUNTER — Encounter: Payer: Self-pay | Admitting: Urology

## 2024-04-15 LAB — PSA: Prostate Specific Ag, Serum: 0.7 ng/mL (ref 0.0–4.0)

## 2025-04-13 ENCOUNTER — Ambulatory Visit: Admitting: Urology

## 2025-04-14 ENCOUNTER — Ambulatory Visit: Admitting: Urology
